# Patient Record
Sex: Female | Born: 1985
Health system: Southern US, Community
[De-identification: ages and names within clinical notes are randomized; demographics above are authoritative.]

## PROBLEM LIST (undated history)

## (undated) DIAGNOSIS — Z789 Other specified health status: Secondary | ICD-10-CM

## (undated) HISTORY — PX: INDUCED ABORTION: SHX677

---
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
## 2004-03-04 ENCOUNTER — Ambulatory Visit (HOSPITAL_COMMUNITY): Admission: RE | Admit: 2004-03-04 | Discharge: 2004-03-04 | Payer: Self-pay | Admitting: Obstetrics

## 2004-03-11 ENCOUNTER — Ambulatory Visit (HOSPITAL_COMMUNITY): Admission: RE | Admit: 2004-03-11 | Discharge: 2004-03-11 | Payer: Self-pay | Admitting: Obstetrics

## 2004-03-18 ENCOUNTER — Ambulatory Visit (HOSPITAL_COMMUNITY): Admission: RE | Admit: 2004-03-18 | Discharge: 2004-03-18 | Payer: Self-pay | Admitting: Obstetrics

## 2004-03-21 ENCOUNTER — Inpatient Hospital Stay (HOSPITAL_COMMUNITY): Admission: AD | Admit: 2004-03-21 | Discharge: 2004-03-24 | Payer: Self-pay | Admitting: Obstetrics

## 2004-03-21 ENCOUNTER — Inpatient Hospital Stay (HOSPITAL_COMMUNITY): Admission: RE | Admit: 2004-03-21 | Discharge: 2004-03-21 | Payer: Self-pay | Admitting: Obstetrics

## 2004-12-16 IMAGING — US US OB LIMITED
1 series · 14 of 28 positions shown · non-contrast
Comparison: none

CLINICAL DATA: 17-year-old.  G2 P0 with LMP of 06/16/03.  Polyhydramnios.  37 weeks.

[Series 1: unknown · 0.32mm/px · 14 of 30 slices shown]
[im 2/30]
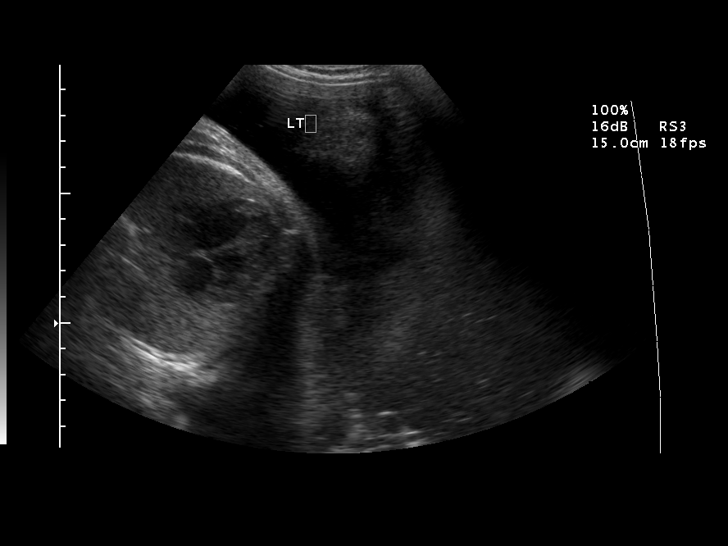
[im 4/30]
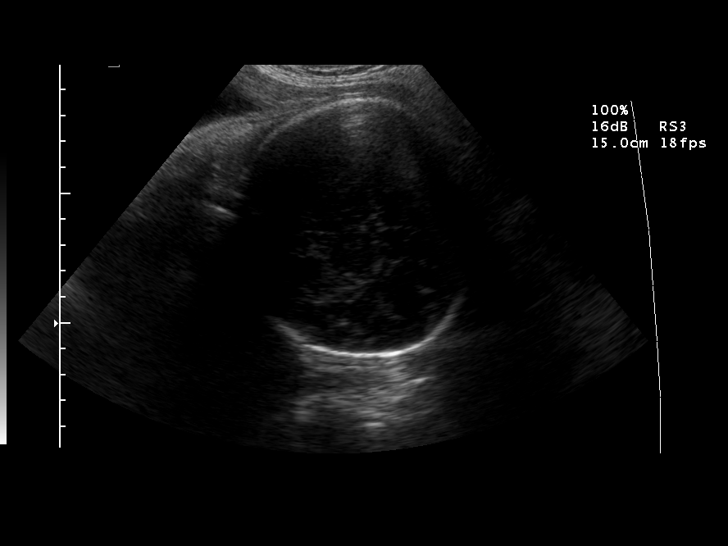
[im 6/30]
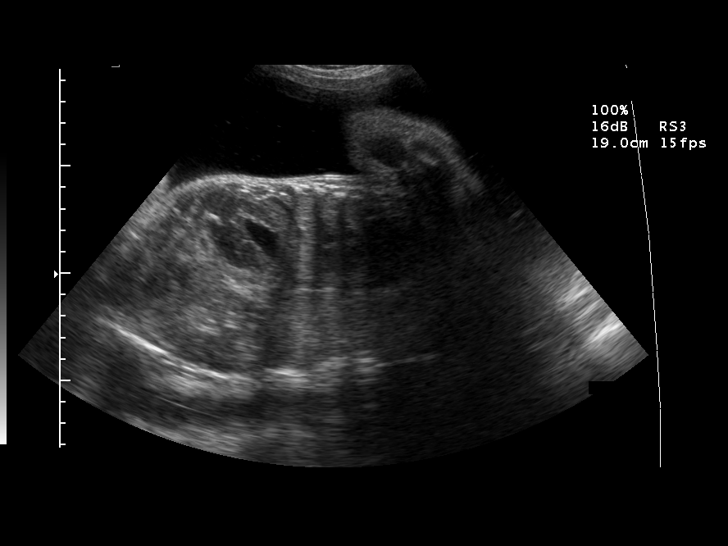
[im 8/30]
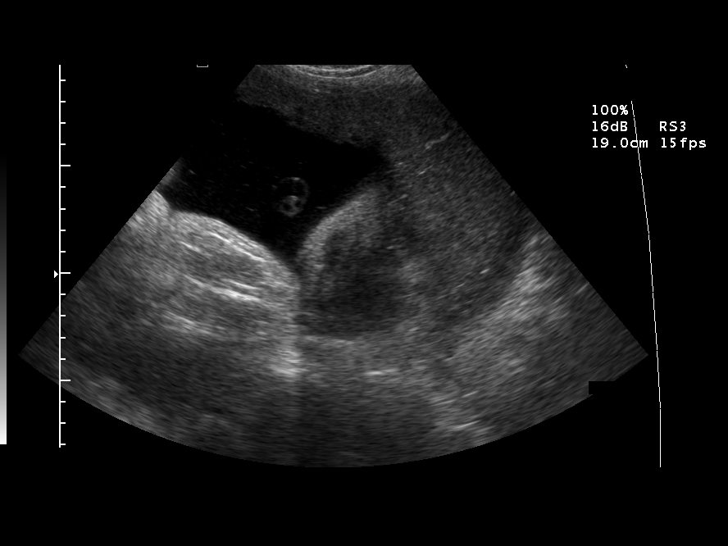
[im 10/30]
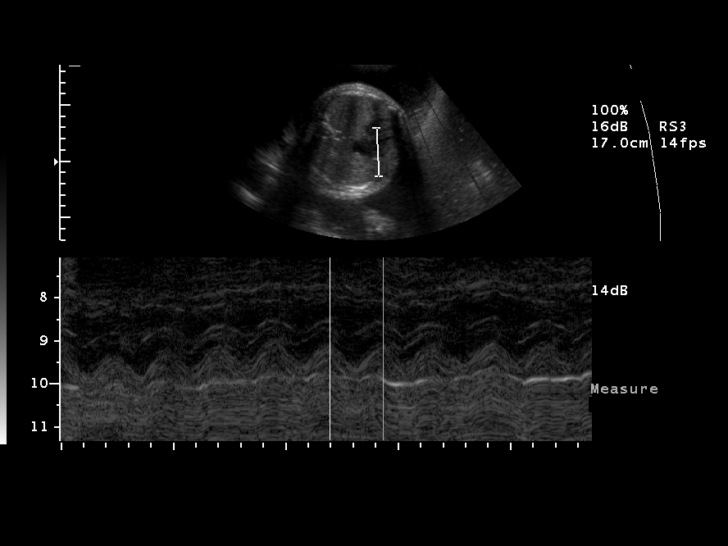
[im 12/30]
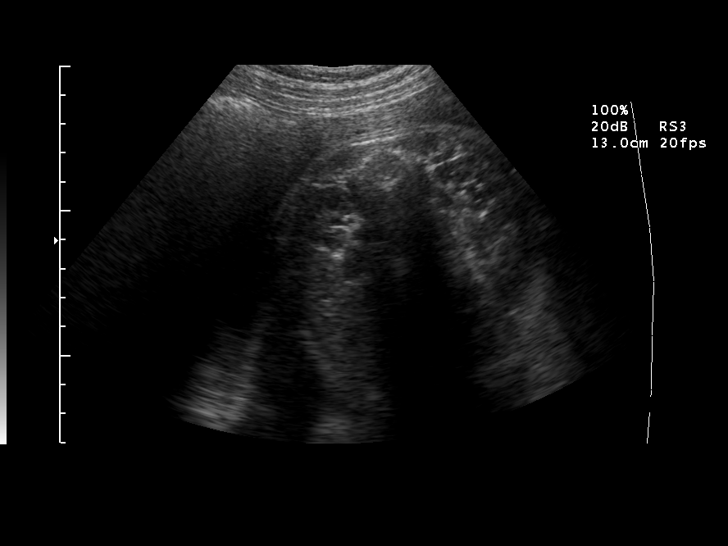
[im 14/30]
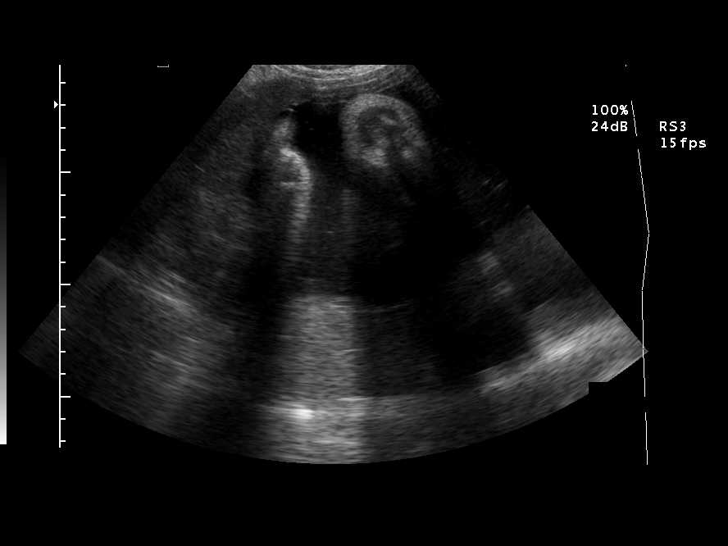
[im 17/30]
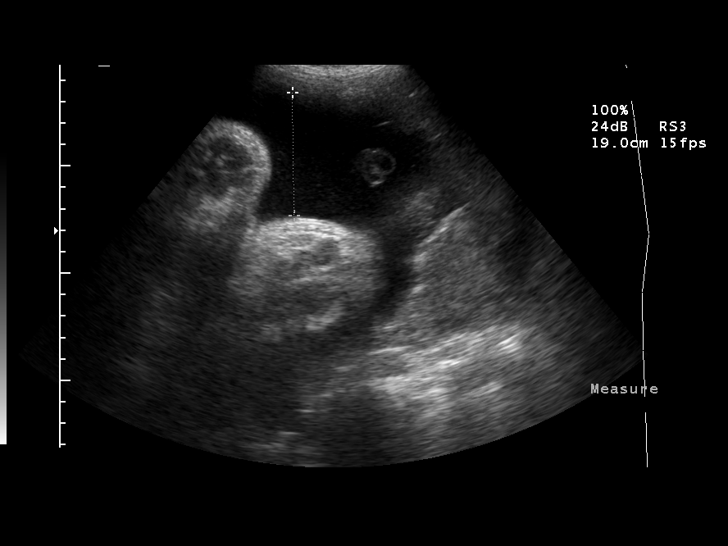
[im 19/30]
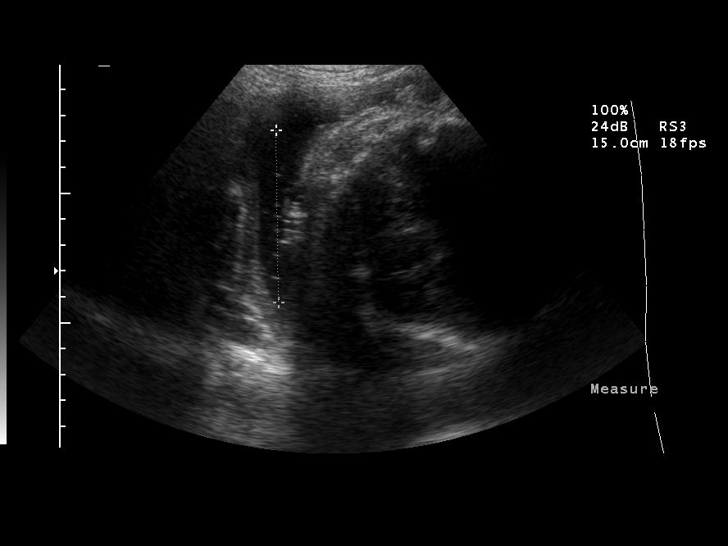
[im 21/30]
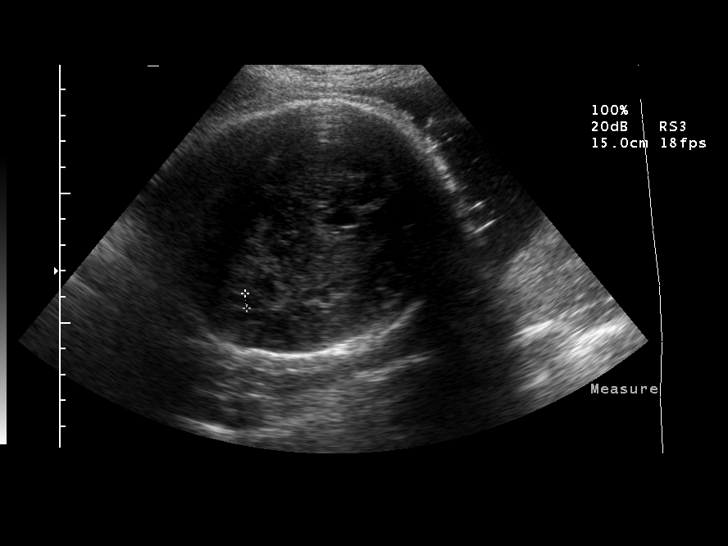
[im 23/30]
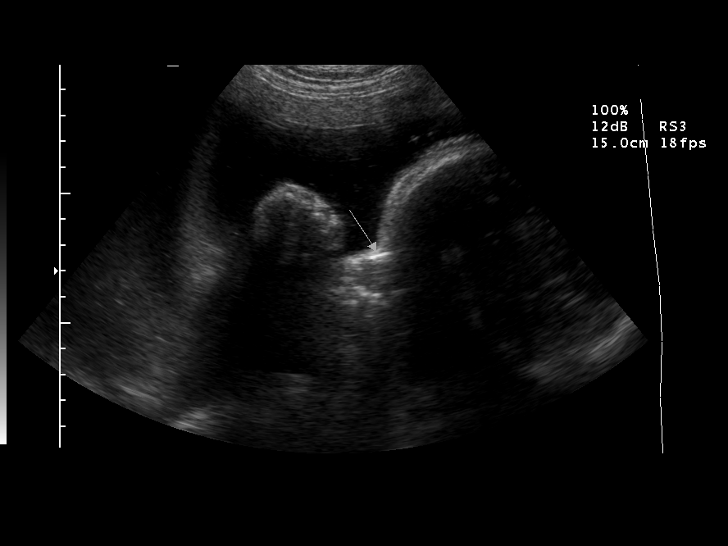
[im 25/30]
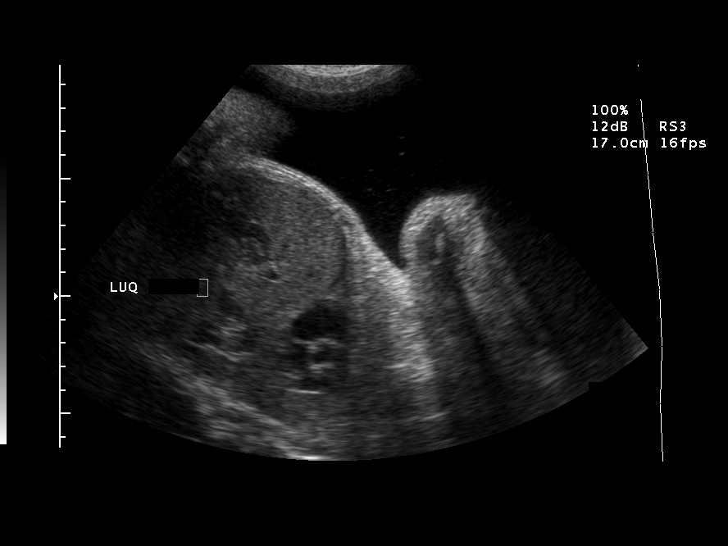
[im 27/30]
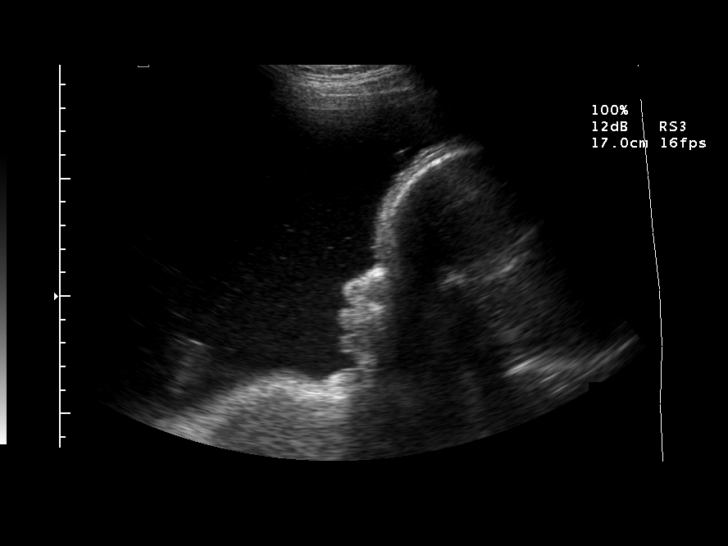
[im 30/30]
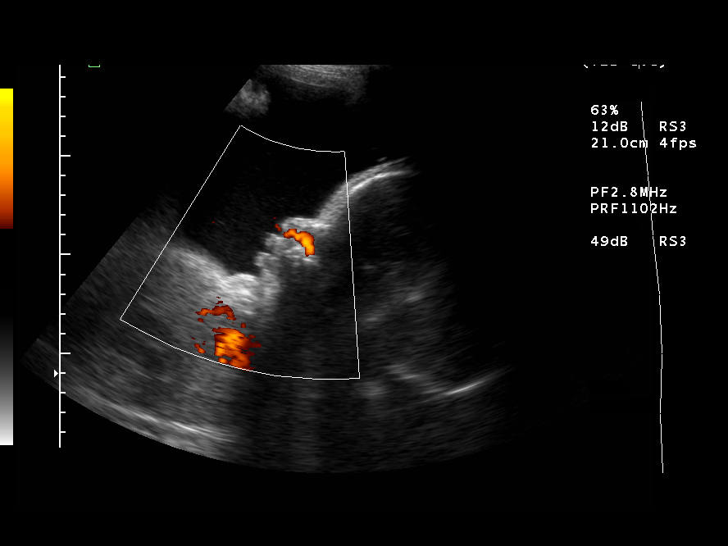

[14 of 28 positions shown; findings below may reference images not displayed]

LIMITED OBSTETRICAL ULTRASOUND
 Number of Fetuses:  1
 Heart Rate:  127
 Movement:  Yes
 Breathing:  Yes
 Presentation:  Cephalic 
 Placental Location:  Fundal, left lateral
 Grade:  II
 Previa:  No
 Amniotic Fluid (Subjective):  Increased
 Amniotic Fluid (Objective):  31.5 cm AFI (5th -95th%ile =   7.3 ? 23.9 cm for 38 wks)
 Assigned GA:  37 w 4 d

 Fetal measurements and complete anatomic evaluation were not requested.  The following fetal anatomy was visualized during this exam:  Lateral ventricles, four chamber heart, 3-vessel cord, kidneys, bladder and diaphragm
 Comment:  No fluid filled stomach observed.  Meconium filled bowel loops are seen in both the left upper quadrant and right upper quadrant.  

 MATERNAL FINDINGS
 Cervix:  Not evaluated
IMPRESSION: Single living intrauterine fetus in cephalic presentation. There continues to be polyhydramnios though AFI is less than on the prior exam.  No fluid-filled stomach is identified on today?s exam.  Question is raised of esophageal atresia.

## 2004-12-19 IMAGING — US US MFM AMNIOCENTESIS (MODIFY)
1 series · 9 of 9 positions shown · non-contrast
Comparison: none

[Series 1: unknown · 0.33mm/px · 9 of 9 slices shown]
[im 1/9]
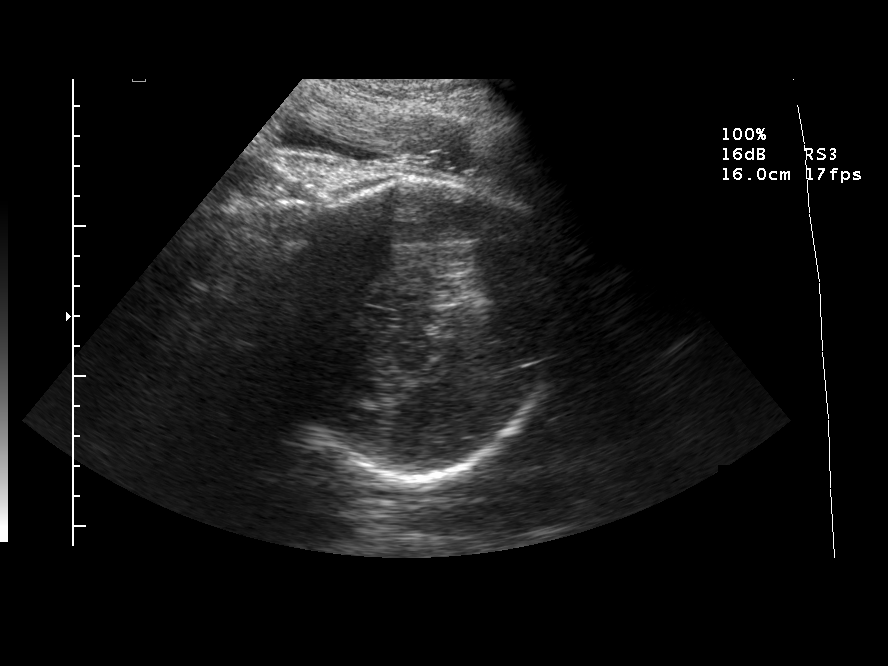
[im 2/9]
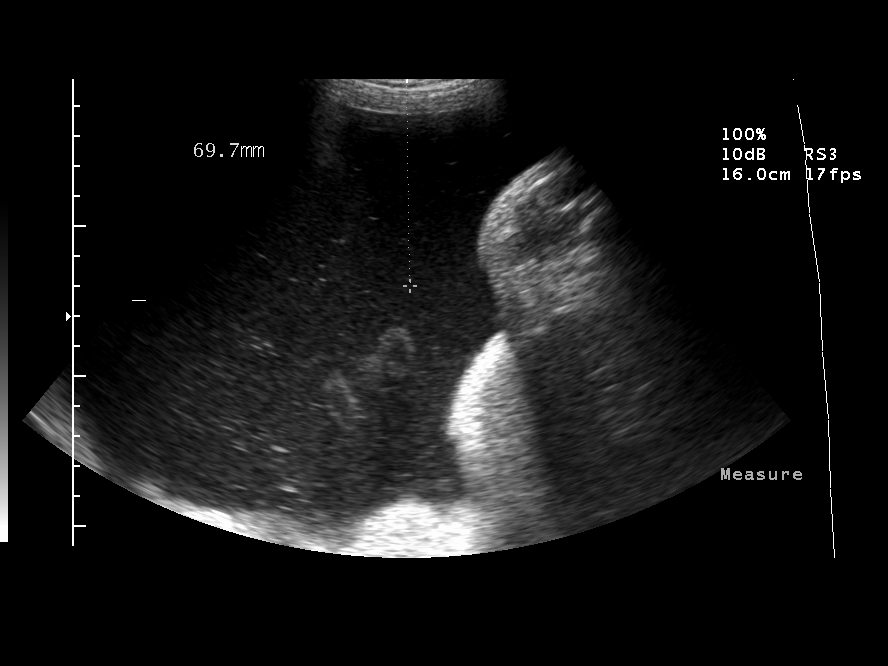
[im 3/9]
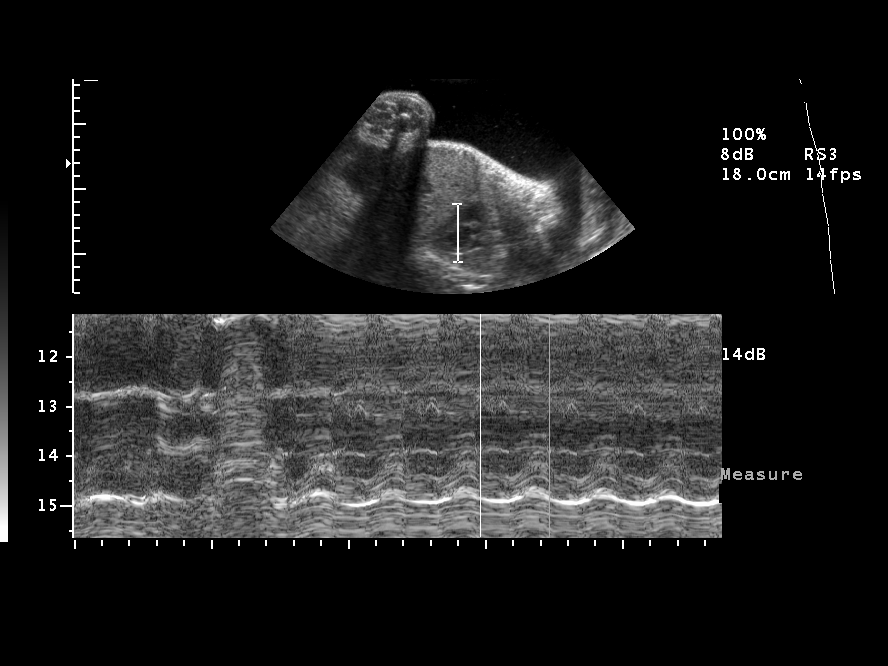
[im 4/9]
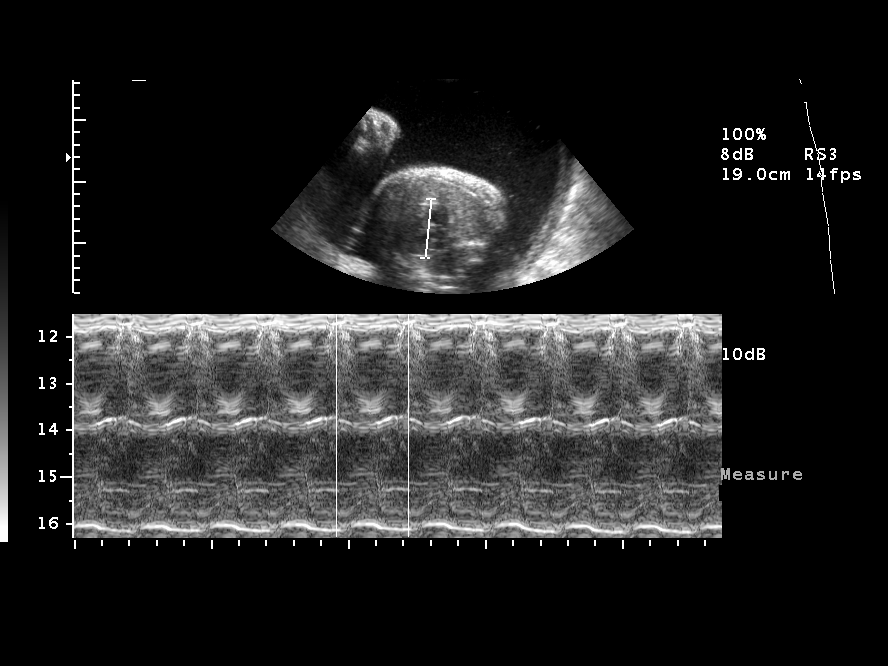
[im 5/9]
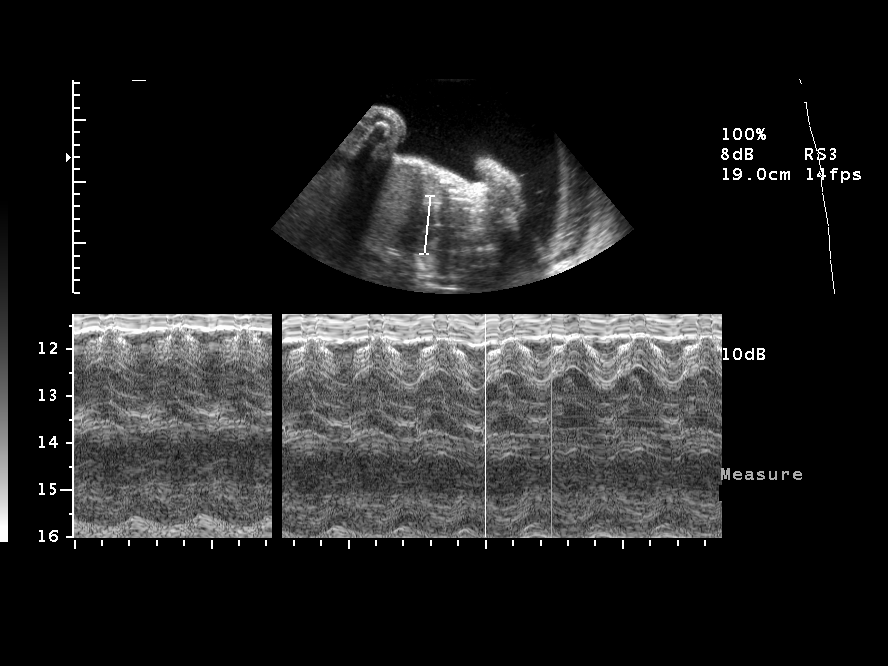
[im 6/9]
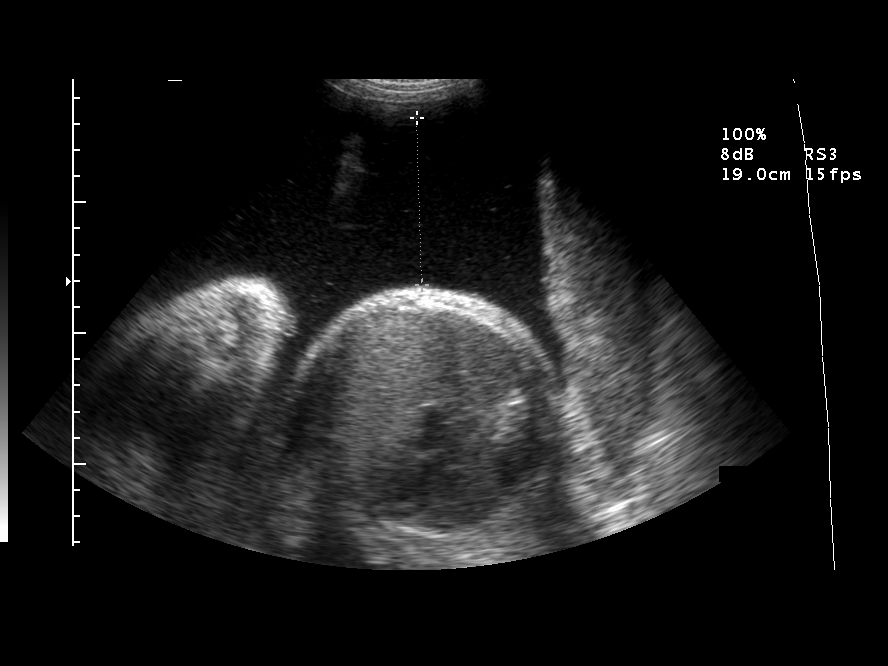
[im 7/9]
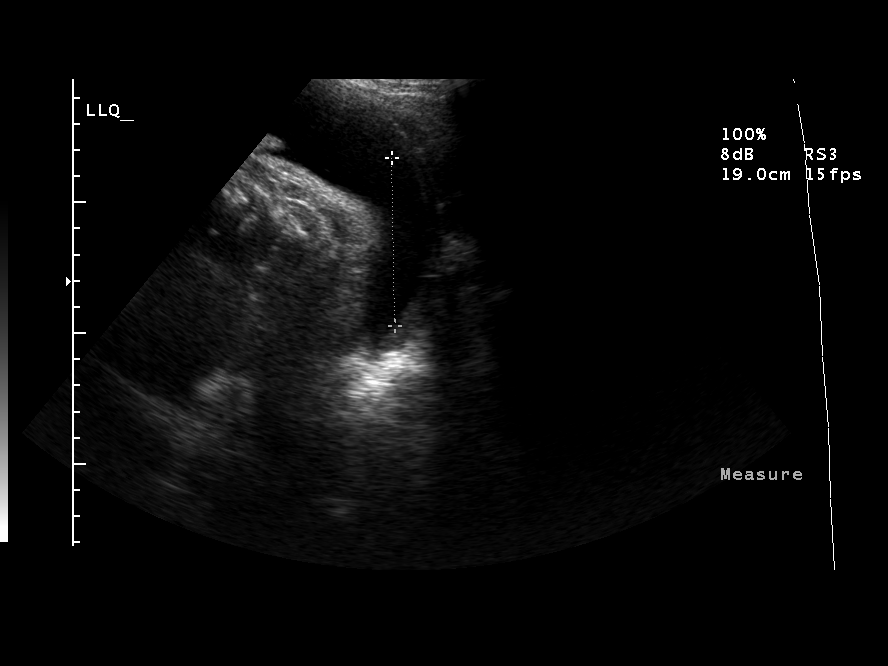
[im 8/9]
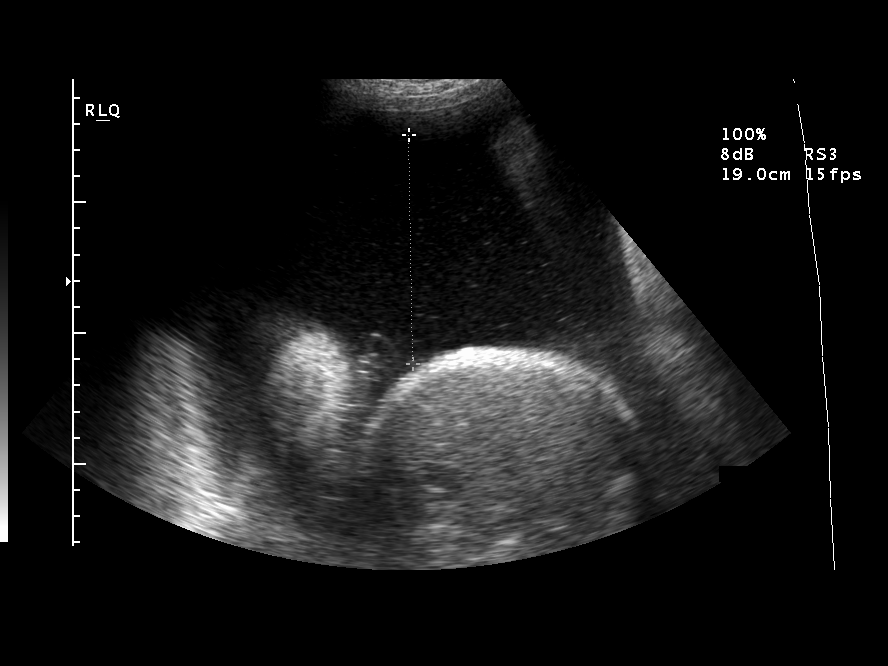
[im 9/9]
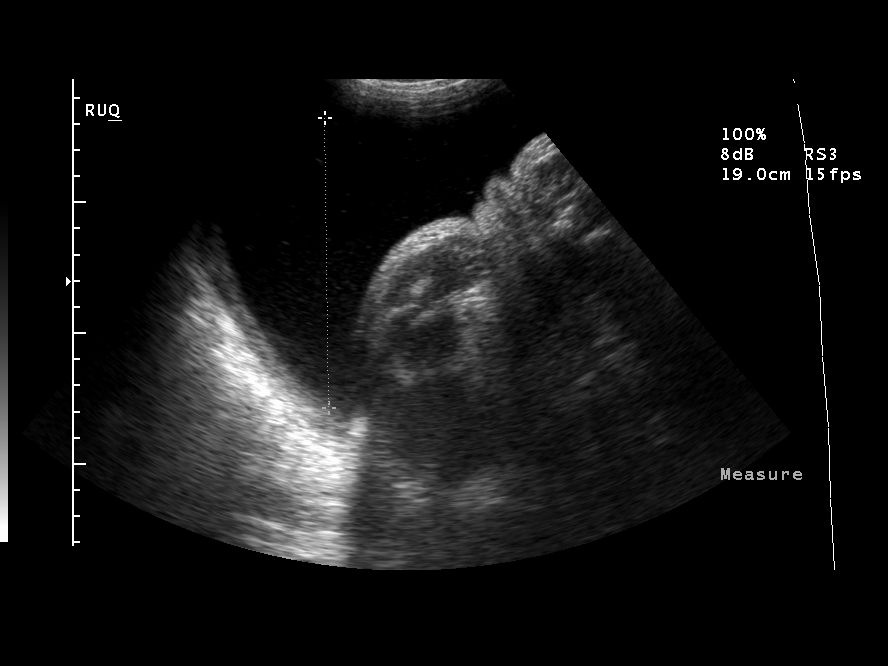

[9 of 9 positions shown; findings below may reference images not displayed]

ULTRASOUND AMNIOCENTESIS: 
 Ultrasound was utilized to perform amniocentesis by the requesting physician.  
 IMPRESSION
 No radiographic interpretation.

## 2005-01-03 ENCOUNTER — Inpatient Hospital Stay (HOSPITAL_COMMUNITY): Admission: AD | Admit: 2005-01-03 | Discharge: 2005-01-03 | Payer: Self-pay | Admitting: *Deleted

## 2005-01-25 ENCOUNTER — Inpatient Hospital Stay (HOSPITAL_COMMUNITY): Admission: AD | Admit: 2005-01-25 | Discharge: 2005-01-25 | Payer: Self-pay | Admitting: Obstetrics

## 2005-02-01 ENCOUNTER — Inpatient Hospital Stay (HOSPITAL_COMMUNITY): Admission: AD | Admit: 2005-02-01 | Discharge: 2005-02-01 | Payer: Self-pay | Admitting: Obstetrics

## 2005-07-20 ENCOUNTER — Emergency Department (HOSPITAL_COMMUNITY): Admission: EM | Admit: 2005-07-20 | Discharge: 2005-07-20 | Payer: Self-pay | Admitting: Emergency Medicine

## 2005-07-22 ENCOUNTER — Emergency Department (HOSPITAL_COMMUNITY): Admission: EM | Admit: 2005-07-22 | Discharge: 2005-07-22 | Payer: Self-pay | Admitting: Emergency Medicine

## 2011-05-01 ENCOUNTER — Other Ambulatory Visit: Payer: Self-pay | Admitting: Obstetrics and Gynecology

## 2014-11-29 ENCOUNTER — Other Ambulatory Visit: Payer: Self-pay | Admitting: Obstetrics & Gynecology

## 2014-12-04 LAB — CYTOLOGY - PAP

## 2015-05-22 ENCOUNTER — Encounter (HOSPITAL_COMMUNITY): Payer: Self-pay | Admitting: Emergency Medicine

## 2015-05-22 ENCOUNTER — Emergency Department (HOSPITAL_COMMUNITY)
Admission: EM | Admit: 2015-05-22 | Discharge: 2015-05-22 | Disposition: A | Discharge status: Home / Self Care | Payer: 59 | Attending: Emergency Medicine | Admitting: Emergency Medicine

## 2015-05-22 ENCOUNTER — Emergency Department (HOSPITAL_COMMUNITY): Payer: 59

## 2015-05-22 DIAGNOSIS — M7712 Lateral epicondylitis, left elbow: Secondary | ICD-10-CM

## 2015-05-22 DIAGNOSIS — M25522 Pain in left elbow: Secondary | ICD-10-CM | POA: Diagnosis present

## 2015-05-22 NOTE — ED Notes (Signed)
Patient transported to X-ray 

## 2015-05-22 NOTE — ED Notes (Signed)
Pt. reports left elbow pain with swelling/stiffness onset this afternoon , denies injury or fall .

## 2015-05-22 NOTE — ED Provider Notes (Signed)
CSN: [id number]     Arrival date & time 05/22/15  2136 History  This chart was scribed for non-physician practitioner working with Mancel BaleElliott Wentz, MD by Richarda Overlieichard Holland, ED Scribe. This patient was seen in room TR04C/TR04C and the patient's care was started at 9:47 PM.   Chief Complaint  Patient presents with  . Elbow Pain   The history is provided by the patient.   HPI Comments: Danielle Ochoa is a 29 y.o. female who presents to the Emergency Department complaining of sharp throbbing left elbow pain that started earlier this afternoon. Pt denies any injuries or falls. She states that bending her elbow drastically aggravates her pain, radiating to her pinky finger. She says that it feels like her elbow is slightly swollen as well. Pt states that she has taken no medications for her symptoms. She reports that she has not been on any extended travel recently. She denies fever, numbness or tingling.  History reviewed. No pertinent past medical history. History reviewed. No pertinent past surgical history. No family history on file. History  Substance Use Topics  . Smoking status: Never Smoker   . Smokeless tobacco: Not on file  . Alcohol Use: Yes   OB History    No data available     Review of Systems  Constitutional: Negative for fever.  Musculoskeletal: Positive for joint swelling and arthralgias.  Neurological: Negative for numbness.  All other systems reviewed and are negative.     Allergies  Review of patient's allergies indicates no known allergies.  Home Medications   Prior to Admission medications   Not on File   BP 139/89 mmHg  Pulse 75  Temp(Src) 98.3 F (36.8 C) (Oral)  Resp 16  SpO2 98%  LMP 04/29/2015 Physical Exam  Constitutional: She is oriented to person, place, and time. She appears well-developed and well-nourished. No distress.  HENT:  Head: Normocephalic and atraumatic.  Mouth/Throat: Oropharynx is clear and moist.  Eyes: Conjunctivae and EOM are  normal.  Neck: Normal range of motion. Neck supple.  Cardiovascular: Normal rate, regular rhythm and normal heart sounds.   Pulmonary/Chest: Effort normal and breath sounds normal. No respiratory distress.  Musculoskeletal:  L elbow TTP over lateral epicondyle with minimal swelling. Pain increased with elbow flexion. No erythema or warmth.  Neurological: She is alert and oriented to person, place, and time. No sensory deficit.  Skin: Skin is warm and dry.  Psychiatric: She has a normal mood and affect. Her behavior is normal.  Nursing note and vitals reviewed.   ED Course  Procedures   DIAGNOSTIC STUDIES: Oxygen Saturation is 100% on RA, normal by my interpretation.    COORDINATION OF CARE: 9:50 PM Discussed treatment plan with pt at bedside and pt agreed to plan.   Labs Review Labs Reviewed - No data to display  Imaging Review Dg Elbow Complete Left  05/22/2015   CLINICAL DATA:  Acute onset of left elbow pain and swelling for 2 days. Initial encounter.  EXAM: LEFT ELBOW - COMPLETE 3+ VIEW  COMPARISON:  None.  FINDINGS: Small calcifications at the lateral epicondyle may reflect lateral epicondylitis. Would correlate for associated symptoms.  There is no evidence of fracture or dislocation. The visualized joint spaces are preserved. No significant joint effusion is identified. The soft tissues are unremarkable in appearance.  IMPRESSION: Calcifications at the lateral epicondyle may reflect underlying lateral epicondylitis. Would correlate for associated symptoms. No evidence of fracture or dislocation.   Electronically Signed   By: Leotis ShamesJeffery  Chang M.D.   On: 05/22/2015 22:30     EKG Interpretation None      MDM   Final diagnoses:  Lateral epicondylitis, left   NAD. Neurovascularly intact. Tenderness over lateral epicondyle with minimal swelling. No erythema or warmth concerning for infection. X-ray showing calcifications at the lateral epicondyle possibly reflecting underlying  lateral epicondylitis. This does associate with symptoms. Advised rest, ice and NSAIDs. If no improvement in one week, follow-up with orthopedics. Stable for discharge. Return precautions given. Patient states understanding of treatment care plan and is agreeable.  I personally performed the services described in this documentation, which was scribed in my presence. The recorded information has been reviewed and is accurate.  Kathrynn Speed, PA-C 05/22/15 2248  Mancel Bale, MD 05/23/15 [phone]

## 2015-05-22 NOTE — Discharge Instructions (Signed)
Rest, ice and elevate your elbow. You may take ibuprofen, 600 mg - 800 mg or naproxen twice daily.  Tennis Elbow Your caregiver has diagnosed you with a condition often referred to as "tennis elbow." This results from small tears or soreness (inflammation) at the start (origin) of the extensor muscles of the forearm. Although the condition is often called tennis or golfer's elbow, it is caused by any repetitive action performed by your elbow. HOME CARE INSTRUCTIONS  If the condition has been short lived, rest may be the only treatment required. Using your opposite hand or arm to perform the task may help. Even changing your grip may help rest the extremity. These may even prevent the condition from recurring.  Longer standing problems, however, will often be relieved faster by:  Using anti-inflammatory agents.  Applying ice packs for 30 minutes at the end of the working day, at bed time, or when activities are finished.  Your caregiver may also have you wear a splint or sling. This will allow the inflamed tendon to heal. At times, steroid injections aided with a local anesthetic will be required along with splinting for 1 to 2 weeks. Two to three steroid injections will often solve the problem. In some long standing cases, the inflamed tendon does not respond to conservative (non-surgical) therapy. Then surgery may be required to repair it. MAKE SURE YOU:   Understand these instructions.  Will watch your condition.  Will get help right away if you are not doing well or get worse. Document Released: 12/07/2005 Document Revised: 02/29/2012 Document Reviewed: 07/25/2008 [facility] Patient Information 2015 [place], [place]. This information is not intended to replace advice given to you by your health care provider. Make sure you discuss any questions you have with your health care provider.  Lateral Epicondylitis (Tennis Elbow) with Rehab Lateral epicondylitis involves inflammation and pain  around the outer portion of the elbow. The pain is caused by inflammation of the tendons in the forearm that bring back (extend) the wrist. Lateral epicondylitis is also called tennis elbow, because it is very common in tennis players. However, it may occur in any individual who extends the wrist repetitively. If lateral epicondylitis is left untreated, it may become a chronic problem. SYMPTOMS   Pain, tenderness, and inflammation on the outer (lateral) side of the elbow.  Pain or weakness with gripping activities.  Pain that increases with wrist-twisting motions (playing tennis, using a screwdriver, opening a door or a jar).  Pain with lifting objects, including a coffee cup. CAUSES  Lateral epicondylitis is caused by inflammation of the tendons that extend the wrist. Causes of injury may include:  Repetitive stress and strain on the muscles and tendons that extend the wrist.  Sudden change in activity level or intensity.  Incorrect grip in racquet sports.  Incorrect grip size of racquet (often too large).  Incorrect hitting position or technique (usually backhand, leading with the elbow).  Using a racket that is too heavy. RISK INCREASES WITH:  Sports or occupations that require repetitive and/or strenuous forearm and wrist movements (tennis, squash, racquetball, carpentry).  Poor wrist and forearm strength and flexibility.  Failure to warm up properly before activity.  Resuming activity before healing, rehabilitation, and conditioning are complete. PREVENTION   Warm up and stretch properly before activity.  Maintain physical fitness:  Strength, flexibility, and endurance.  Cardiovascular fitness.  Wear and use properly fitted equipment.  Learn and use proper technique and have a coach correct improper technique.  Wear a  tennis elbow (counterforce) brace. PROGNOSIS  The course of this condition depends on the degree of the injury. If treated properly, acute cases  (symptoms lasting less than 4 weeks) are often resolved in 2 to 6 weeks. Chronic (longer lasting cases) often resolve in 3 to 6 months but may require physical therapy. RELATED COMPLICATIONS   Frequently recurring symptoms, resulting in a chronic problem. Properly treating the problem the first time decreases frequency of recurrence.  Chronic inflammation, scarring tendon degeneration, and partial tendon tear, requiring surgery.  Delayed healing or resolution of symptoms. TREATMENT  Treatment first involves the use of ice and medicine to reduce pain and inflammation. Strengthening and stretching exercises may help reduce discomfort if performed regularly. These exercises may be performed at home if the condition is an acute injury. Chronic cases may require a referral to a physical therapist for evaluation and treatment. Your caregiver may advise a corticosteroid injection to help reduce inflammation. Rarely, surgery is needed. MEDICATION  If pain medicine is needed, nonsteroidal anti-inflammatory medicines (aspirin and ibuprofen), or other minor pain relievers (acetaminophen), are often advised.  Do not take pain medicine for 7 days before surgery.  Prescription pain relievers may be given, if your caregiver thinks they are needed. Use only as directed and only as much as you need.  Corticosteroid injections may be recommended. These injections should be reserved only for the most severe cases, because they can only be given a certain number of times. HEAT AND COLD  Cold treatment (icing) should be applied for 10 to 15 minutes every 2 to 3 hours for inflammation and pain, and immediately after activity that aggravates your symptoms. Use ice packs or an ice massage.  Heat treatment may be used before performing stretching and strengthening activities prescribed by your caregiver, physical therapist, or athletic trainer. Use a heat pack or a warm water soak. SEEK MEDICAL CARE IF: Symptoms get  worse or do not improve in 2 weeks, despite treatment. EXERCISES  RANGE OF MOTION (ROM) AND STRETCHING EXERCISES - Epicondylitis, Lateral (Tennis Elbow) These exercises may help you when beginning to rehabilitate your injury. Your symptoms may go away with or without further involvement from your physician, physical therapist, or athletic trainer. While completing these exercises, remember:   Restoring tissue flexibility helps normal motion to return to the joints. This allows healthier, less painful movement and activity.  An effective stretch should be held for at least 30 seconds.  A stretch should never be painful. You should only feel a gentle lengthening or release in the stretched tissue. RANGE OF MOTION - Wrist Flexion, Active-Assisted  Extend your right / left elbow with your fingers pointing down.*  Gently pull the back of your hand towards you, until you feel a gentle stretch on the top of your forearm.  Hold this position for __________ seconds. Repeat __________ times. Complete this exercise __________ times per day.  *If directed by your physician, physical therapist or athletic trainer, complete this stretch with your elbow bent, rather than extended. RANGE OF MOTION - Wrist Extension, Active-Assisted  Extend your right / left elbow and turn your palm upwards.*  Gently pull your palm and fingertips back, so your wrist extends and your fingers point more toward the ground.  You should feel a gentle stretch on the inside of your forearm.  Hold this position for __________ seconds. Repeat __________ times. Complete this exercise __________ times per day. *If directed by your physician, physical therapist or athletic trainer, complete this stretch  with your elbow bent, rather than extended. STRETCH - Wrist Flexion  Place the back of your right / left hand on a tabletop, leaving your elbow slightly bent. Your fingers should point away from your body.  Gently press the back  of your hand down onto the table by straightening your elbow. You should feel a stretch on the top of your forearm.  Hold this position for __________ seconds. Repeat __________ times. Complete this stretch __________ times per day.  STRETCH - Wrist Extension   Place your right / left fingertips on a tabletop, leaving your elbow slightly bent. Your fingers should point backwards.  Gently press your fingers and palm down onto the table by straightening your elbow. You should feel a stretch on the inside of your forearm.  Hold this position for __________ seconds. Repeat __________ times. Complete this stretch __________ times per day.  STRENGTHENING EXERCISES - Epicondylitis, Lateral (Tennis Elbow) These exercises may help you when beginning to rehabilitate your injury. They may resolve your symptoms with or without further involvement from your physician, physical therapist, or athletic trainer. While completing these exercises, remember:   Muscles can gain both the endurance and the strength needed for everyday activities through controlled exercises.  Complete these exercises as instructed by your physician, physical therapist or athletic trainer. Increase the resistance and repetitions only as guided.  You may experience muscle soreness or fatigue, but the pain or discomfort you are trying to eliminate should never worsen during these exercises. If this pain does get worse, stop and make sure you are following the directions exactly. If the pain is still present after adjustments, discontinue the exercise until you can discuss the trouble with your caregiver. STRENGTH - Wrist Flexors  Sit with your right / left forearm palm-up and fully supported on a table or countertop. Your elbow should be resting below the height of your shoulder. Allow your wrist to extend over the edge of the surface.  Loosely holding a __________ weight, or a piece of rubber exercise band or tubing, slowly curl your  hand up toward your forearm.  Hold this position for __________ seconds. Slowly lower the wrist back to the starting position in a controlled manner. Repeat __________ times. Complete this exercise __________ times per day.  STRENGTH - Wrist Extensors  Sit with your right / left forearm palm-down and fully supported on a table or countertop. Your elbow should be resting below the height of your shoulder. Allow your wrist to extend over the edge of the surface.  Loosely holding a __________ weight, or a piece of rubber exercise band or tubing, slowly curl your hand up toward your forearm.  Hold this position for __________ seconds. Slowly lower the wrist back to the starting position in a controlled manner. Repeat __________ times. Complete this exercise __________ times per day.  STRENGTH - Ulnar Deviators  Stand with a ____________________ weight in your right / left hand, or sit while holding a rubber exercise band or tubing, with your healthy arm supported on a table or countertop.  Move your wrist, so that your pinkie travels toward your forearm and your thumb moves away from your forearm.  Hold this position for __________ seconds and then slowly lower the wrist back to the starting position. Repeat __________ times. Complete this exercise __________ times per day STRENGTH - Radial Deviators  Stand with a ____________________ weight in your right / left hand, or sit while holding a rubber exercise band or tubing, with your  injured arm supported on a table or countertop.  Raise your hand upward in front of you or pull up on the rubber tubing.  Hold this position for __________ seconds and then slowly lower the wrist back to the starting position. Repeat __________ times. Complete this exercise __________ times per day. STRENGTH - Forearm Supinators   Sit with your right / left forearm supported on a table, keeping your elbow below shoulder height. Rest your hand over the edge, palm  down.  Gently grip a hammer or a soup ladle.  Without moving your elbow, slowly turn your palm and hand upward to a "thumbs-up" position.  Hold this position for __________ seconds. Slowly return to the starting position. Repeat __________ times. Complete this exercise __________ times per day.  STRENGTH - Forearm Pronators   Sit with your right / left forearm supported on a table, keeping your elbow below shoulder height. Rest your hand over the edge, palm up.  Gently grip a hammer or a soup ladle.  Without moving your elbow, slowly turn your palm and hand upward to a "thumbs-up" position.  Hold this position for __________ seconds. Slowly return to the starting position. Repeat __________ times. Complete this exercise __________ times per day.  STRENGTH - Grip  Grasp a tennis ball, a dense sponge, or a large, rolled sock in your hand.  Squeeze as hard as you can, without increasing any pain.  Hold this position for __________ seconds. Release your grip slowly. Repeat __________ times. Complete this exercise __________ times per day.  STRENGTH - Elbow Extensors, Isometric  Stand or sit upright, on a firm surface. Place your right / left arm so that your palm faces your stomach, and it is at the height of your waist.  Place your opposite hand on the underside of your forearm. Gently push up as your right / left arm resists. Push as hard as you can with both arms, without causing any pain or movement at your right / left elbow. Hold this stationary position for __________ seconds. Gradually release the tension in both arms. Allow your muscles to relax completely before repeating. Document Released: 12/07/2005 Document Revised: 04/23/2014 Document Reviewed: 03/21/2009 [facility] Patient Information 2015 [place], [place]. This information is not intended to replace advice given to you by your health care provider. Make sure you discuss any questions you have with your health care  provider.

## 2015-11-28 ENCOUNTER — Inpatient Hospital Stay (HOSPITAL_COMMUNITY)
Admission: AD | Admit: 2015-11-28 | Discharge: 2015-11-28 | Disposition: A | Discharge status: Home / Self Care | Payer: 59 | Source: Ambulatory Visit | Attending: Obstetrics and Gynecology | Admitting: Obstetrics and Gynecology

## 2015-11-28 ENCOUNTER — Inpatient Hospital Stay (HOSPITAL_COMMUNITY): Payer: 59

## 2015-11-28 ENCOUNTER — Encounter (HOSPITAL_COMMUNITY): Payer: Self-pay | Admitting: *Deleted

## 2015-11-28 DIAGNOSIS — O209 Hemorrhage in early pregnancy, unspecified: Secondary | ICD-10-CM

## 2015-11-28 DIAGNOSIS — Z3A01 Less than 8 weeks gestation of pregnancy: Secondary | ICD-10-CM | POA: Diagnosis not present

## 2015-11-28 DIAGNOSIS — O00109 Unspecified tubal pregnancy without intrauterine pregnancy: Secondary | ICD-10-CM

## 2015-11-28 DIAGNOSIS — O001 Tubal pregnancy without intrauterine pregnancy: Secondary | ICD-10-CM | POA: Insufficient documentation

## 2015-11-28 HISTORY — DX: Other specified health status: Z78.9

## 2015-11-28 LAB — URINALYSIS, ROUTINE W REFLEX MICROSCOPIC
BILIRUBIN URINE: NEGATIVE
GLUCOSE, UA: NEGATIVE mg/dL
Ketones, ur: NEGATIVE mg/dL
Leukocytes, UA: NEGATIVE
Nitrite: NEGATIVE
PROTEIN: NEGATIVE mg/dL
Specific Gravity, Urine: 1.015 (ref 1.005–1.030)
pH: 6.5 (ref 5.0–8.0)

## 2015-11-28 LAB — CBC
HEMATOCRIT: 37.2 % (ref 36.0–46.0)
HEMOGLOBIN: 12.6 g/dL (ref 12.0–15.0)
MCH: 30.9 pg (ref 26.0–34.0)
MCHC: 33.9 g/dL (ref 30.0–36.0)
MCV: 91.2 fL (ref 78.0–100.0)
Platelets: 275 10*3/uL (ref 150–400)
RBC: 4.08 MIL/uL (ref 3.87–5.11)
RDW: 12.6 % (ref 11.5–15.5)
WBC: 6 10*3/uL (ref 4.0–10.5)

## 2015-11-28 LAB — CREATININE, SERUM
CREATININE: 0.65 mg/dL (ref 0.44–1.00)
GFR calc Af Amer: >60 mL/min (ref >60)

## 2015-11-28 LAB — ABO/RH: ABO/RH(D): O POS

## 2015-11-28 LAB — BUN: BUN: 12 mg/dL (ref 6–20)

## 2015-11-28 LAB — URINE MICROSCOPIC-ADD ON: WBC, UA: NONE SEEN WBC/hpf (ref 0–5)

## 2015-11-28 LAB — AST: AST: 16 U/L (ref 15–41)

## 2015-11-28 LAB — POCT PREGNANCY, URINE: PREG TEST UR: POSITIVE — AB

## 2015-11-28 LAB — HCG, QUANTITATIVE, PREGNANCY: HCG, BETA CHAIN, QUANT, S: 1967 m[IU]/mL — AB (ref <5)

## 2015-11-28 MED ORDER — KETOROLAC TROMETHAMINE 60 MG/2ML IM SOLN
60.0000 mg | Freq: Once | INTRAMUSCULAR | Status: AC
  Administered 2015-11-28: 60 mg via INTRAMUSCULAR
  Filled 2015-11-28: qty 2

## 2015-11-28 MED ORDER — HYDROCODONE-ACETAMINOPHEN 5-325 MG PO TABS
1.0000 | ORAL_TABLET | Freq: Once | ORAL | Status: AC
  Administered 2015-11-28: 1 via ORAL
  Filled 2015-11-28: qty 1

## 2015-11-28 MED ORDER — METHOTREXATE INJECTION FOR WOMEN'S HOSPITAL
50.0000 mg/m2 | Freq: Once | INTRAMUSCULAR | Status: AC
  Administered 2015-11-28: 85 mg via INTRAMUSCULAR
  Filled 2015-11-28: qty 1.7

## 2015-11-28 NOTE — Discharge Instructions (Signed)
Methotrexate Treatment for an Ectopic Pregnancy °Methotrexate is a medicine that treats ectopic pregnancy by stopping the growth of the fertilized egg. It also helps your body absorb tissue from the egg. This takes between 2 weeks and 6 weeks. Most ectopic pregnancies can be successfully treated with methotrexate if they are detected early enough. °LET YOUR HEALTH CARE PROVIDER KNOW ABOUT: °· Any allergies you have. °· All medicines you are taking, including vitamins, herbs, eye drops, creams, and over-the-counter medicines. °· Medical conditions you have. °RISKS AND COMPLICATIONS °Generally, this is a safe treatment. However, as with any treatment, problems can occur. Possible problems or side effects include: °· Nausea. °· Vomiting. °· Diarrhea. °· Abdominal cramping. °· Mouth sores. °· Increased vaginal bleeding or spotting.   °· Swelling or irritation of the lining of your lungs (pneumonitis).  °· Failed treatment and continuation of the pregnancy.   °· Liver damage. °· Hair loss. °There is still a risk of the ectopic pregnancy rupturing while using the methotrexate. °BEFORE THE PROCEDURE °Before you take the medicine:  °· Liver tests, kidney tests, and a complete blood test are performed. °· Blood tests are performed to measure the pregnancy hormone levels and to determine your blood type. °· If you are Rh-negative and the father is Rh-positive or his Rh type is not known, you will be given a Rho (D) immune globulin shot. °PROCEDURE  °There are two methods that your health care provider may use to prescribe methotrexate. One method involves a single dose or injection of the medicine. Another method involves a series of doses given through several injections.  °AFTER THE PROCEDURE °· You may have some abdominal cramping, vaginal bleeding, and fatigue in the first few days after taking methotrexate. °· Blood tests will be taken for several weeks to check the pregnancy hormone levels. The blood tests are performed  until there is no more pregnancy hormone detected in the blood. °  °This information is not intended to replace advice given to you by your health care provider. Make sure you discuss any questions you have with your health care provider. °  °Document Released: 12/01/2001 Document Revised: 12/28/2014 Document Reviewed: 09/25/2013 °Elsevier Interactive Patient Education ©2016 [employer] ° °Ectopic Pregnancy °An ectopic pregnancy is when the fertilized egg attaches (implants) outside the uterus. Most ectopic pregnancies occur in the fallopian tube. Rarely do ectopic pregnancies occur on the ovary, intestine, pelvis, or cervix. In an ectopic pregnancy, the fertilized egg does not have the ability to develop into a normal, healthy baby.  °A ruptured ectopic pregnancy is one in which the fallopian tube gets torn or bursts and results in internal bleeding. Often there is intense abdominal pain, and sometimes, vaginal bleeding. Having an ectopic pregnancy can be life threatening. If left untreated, this dangerous condition can lead to a blood transfusion, abdominal surgery, or even death. °CAUSES  °Damage to the fallopian tubes is the suspected cause in most ectopic pregnancies.  °RISK FACTORS °Depending on your circumstances, the risk of having an ectopic pregnancy will vary. The level of risk can be divided into three categories. °High Risk °· You have gone through infertility treatment. °· You have had a previous ectopic pregnancy. °· You have had previous tubal surgery. °· You have had previous surgery to have the fallopian tubes tied (tubal ligation). °· You have tubal problems or diseases. °· You have been exposed to DES. DES is a medicine that was used until 1971 and had effects on babies whose mothers took the medicine. °·   You become pregnant while using an intrauterine device (IUD) for birth control.  °Moderate Risk °· You have a history of infertility. °· You have a history of a sexually transmitted infection  (STI). °· You have a history of pelvic inflammatory disease (PID). °· You have scarring from endometriosis. °· You have multiple sexual partners. °· You smoke.  °Low Risk °· You have had previous pelvic surgery. °· You use vaginal douching. °· You became sexually active before 31 years of age. °SIGNS AND SYMPTOMS  °An ectopic pregnancy should be suspected in anyone who has missed a period and has abdominal pain or bleeding. °· You may experience normal pregnancy symptoms, such as: °¨ Nausea. °¨ Tiredness. °¨ Breast tenderness. °· Other symptoms may include: °¨ Pain with intercourse. °¨ Irregular vaginal bleeding or spotting. °¨ Cramping or pain on one side or in the lower abdomen. °¨ Fast heartbeat. °¨ Passing out while having a bowel movement. °· Symptoms of a ruptured ectopic pregnancy and internal bleeding may include: °¨ Sudden, severe pain in the abdomen and pelvis. °¨ Dizziness or fainting. °¨ Pain in the shoulder area. °DIAGNOSIS  °Tests that may be performed include: °· A pregnancy test. °· An ultrasound test. °· Testing the specific level of pregnancy hormone in the bloodstream. °· Taking a sample of uterus tissue (dilation and curettage, D&C). °· Surgery to perform a visual exam of the inside of the abdomen using a thin, lighted tube with a tiny camera on the end (laparoscope). °TREATMENT  °An injection of a medicine called methotrexate may be given. This medicine causes the pregnancy tissue to be absorbed. It is given if: °· The diagnosis is made early. °· The fallopian tube has not ruptured. °· You are considered to be a good candidate for the medicine. °Usually, pregnancy hormone blood levels are checked after methotrexate treatment. This is to be sure the medicine is effective. It may take 4-6 weeks for the pregnancy to be absorbed (though most pregnancies will be absorbed by 3 weeks). °Surgical treatment may be needed. A laparoscope may be used to remove the pregnancy tissue. If severe internal  bleeding occurs, a cut (incision) may be made in the lower abdomen (laparotomy), and the ectopic pregnancy is removed. This stops the bleeding. Part of the fallopian tube, or the whole tube, may be removed as well (salpingectomy). After surgery, pregnancy hormone tests may be done to be sure there is no pregnancy tissue left. You may receive a Rho (D) immune globulin shot if you are Rh negative and the father is Rh positive, or if you do not know the Rh type of the father. This is to prevent problems with any future pregnancy. °SEEK IMMEDIATE MEDICAL CARE IF:  °You have any symptoms of an ectopic pregnancy. This is a medical emergency. °MAKE SURE YOU: °· Understand these instructions. °· Will watch your condition. °· Will get help right away if you are not doing well or get worse. °  °This information is not intended to replace advice given to you by your health care provider. Make sure you discuss any questions you have with your health care provider. °  °Document Released: 01/14/2005 Document Revised: 12/28/2014 Document Reviewed: 07/06/2013 °Elsevier Interactive Patient Education ©2016 Elsevier Inc. ° °

## 2015-11-28 NOTE — MAU Provider Note (Signed)
History     CSN: [id number]  Arrival date and time: 11/28/15 3382   First Provider Initiated Contact with Patient 11/28/15 0809      No chief complaint on file.  HPI   Danielle Ochoa is a 29 y.o. female G5P1030 at 28w6dpresenting to MAU with vaginal bleeding and worsening abdominal pressure/pain. She has had mild pain since Monday, however it has progressively gotten worse.   She had her levels drawn on Monday in the office and it came back at 600 and she went back 48 hours later and had another quant was done that was in the 1700's. She had an [place]ad[place] which showed nothing in her uterus.   She came to MAU today because she was having excruciating pain. She says her blood is very bright red and very heavy.  She is worried.   OB History    Gravida Para Term Preterm AB TAB SAB Ectopic Multiple Living   '5 1 1  3 2 1   ' 0      Past Medical History  Diagnosis Date  . Medical history non-contributory     Past Surgical History  Procedure Laterality Date  . Induced abortion      Family History  Problem Relation Age of Onset  . Alcohol abuse Neg Hx   . Arthritis Neg Hx   . Asthma Neg Hx   . Birth defects Neg Hx   . Cancer Neg Hx   . COPD Neg Hx   . Depression Neg Hx   . Diabetes Neg Hx   . Drug abuse Neg Hx   . Early death Neg Hx   . Hearing loss Neg Hx   . Heart disease Neg Hx   . Hyperlipidemia Neg Hx   . Hypertension Neg Hx   . Kidney disease Neg Hx   . Learning disabilities Neg Hx   . Mental illness Neg Hx   . Mental retardation Neg Hx   . Miscarriages / Stillbirths Neg Hx   . Stroke Neg Hx   . Vision loss Neg Hx   . Varicose Veins Neg Hx     Social History  Substance Use Topics  . Smoking status: Never Smoker   . Smokeless tobacco: None  . Alcohol Use: Yes    Allergies: No Known Allergies  No prescriptions prior to admission   Results for orders placed or performed during the hospital encounter of 11/28/15 (from the past 48 hour(s))   [facility]inal[facility], [facility]     Status: Abnormal   Collection Time: 11/28/15  7:05 AM  Result Value Ref Range   Color, Urine YELLOW YELLOW   APPearance HAZY (A) CLEAR   Specific Gravity, Urine 1.015 1.005 - 1.030   pH 6.5 5.0 - 8.0   Glucose, UA NEGATIVE NEGATIVE mg/dL   Hgb urine dipstick LARGE (A) NEGATIVE   Bilirubin Urine NEGATIVE NEGATIVE   Ketones, ur NEGATIVE NEGATIVE mg/dL   Protein, ur NEGATIVE NEGATIVE mg/dL   Nitrite NEGATIVE NEGATIVE   Leukocytes, UA NEGATIVE NEGATIVE  Urine microscopic-add on     Status: Abnormal   Collection Time: 11/28/15  7:05 AM  Result Value Ref Range   Squamous Epithelial / LPF 0-5 (A) NONE SEEN   WBC, UA NONE SEEN 0 - 5 WBC/hpf   RBC / HPF TOO NUMEROUS TO COUNT 0 - 5 RBC/hpf   Bacteria, UA RARE (A) NONE SEEN  Pregnancy, urine POC     Status:  Abnormal   Collection Time: 11/28/15  7:29 AM  Result Value Ref Range   Preg Test, Ur POSITIVE (A) NEGATIVE    Comment:        THE SENSITIVITY OF THIS METHODOLOGY IS >24 mIU/mL   CBC     Status: None   Collection Time: 11/28/15  8:15 AM  Result Value Ref Range   WBC 6.0 4.0 - 10.5 K/uL   RBC 4.08 3.87 - 5.11 MIL/uL   Hemoglobin 12.6 12.0 - 15.0 g/dL   HCT 37.2 36.0 - 46.0 %   MCV 91.2 78.0 - 100.0 fL   MCH 30.9 26.0 - 34.0 pg   MCHC 33.9 30.0 - 36.0 g/dL   RDW 12.6 11.5 - 15.5 %   Platelets 275 150 - 400 K/uL  ABO/Rh     Status: None   Collection Time: 11/28/15  8:15 AM  Result Value Ref Range   ABO/RH(D) O POS   hCG, quantitative, pregnancy     Status: Abnormal   Collection Time: 11/28/15  8:15 AM  Result Value Ref Range   hCG, Beta Chain, Quant, S 1967 (H) <5 mIU/mL    Comment:          GEST. AGE      CONC.  (mIU/mL)   <=1 WEEK        5 - 50     2 WEEKS       50 - 500     3 WEEKS       100 - 10,000     4 WEEKS     1,000 - 30,000     5 WEEKS     3,500 - 115,000   6-8 WEEKS     12,000 - 270,000    12 WEEKS     15,000 - 220,000        FEMALE AND NON-PREGNANT  FEMALE:     LESS THAN 5 mIU/mL   AST     Status: None   Collection Time: 11/28/15  8:15 AM  Result Value Ref Range   AST 16 15 - 41 U/L  BUN     Status: None   Collection Time: 11/28/15  8:15 AM  Result Value Ref Range   BUN 12 6 - 20 mg/dL  Creatinine, serum     Status: None   Collection Time: 11/28/15  8:15 AM  Result Value Ref Range   Creatinine, Ser 0.65 0.44 - 1.00 mg/dL   GFR calc non Af Amer >60 >60 mL/min   GFR calc Af Amer >60 >60 mL/min    Comment: (NOTE) The eGFR has been calculated using the CKD EPI equation. This calculation has not been validated in all clinical situations. eGFR's persistently <60 mL/min signify possible Chronic Kidney Disease.    US Ob Comp Less 14 Wks  11/28/2015  CLINICAL DATA:  29 year old pregnant female presenting with 3 days of vaginal bleeding. Quantitative beta HCG 1,967. EDC by LMP: 07/31/2016, projecting to an expected gestational age of [redacted] weeks 6 days. EXAM: OBSTETRIC <14 WK [place] AND TRANSVAGINAL OB US TECHNIQUE: Both transabdominal and transvaginal ultrasound examinations were performed for complete evaluation of the gestation as well as the maternal uterus, adnexal regions, and pelvic cul-de-sac. Transvaginal technique was performed to assess early pregnancy. COMPARISON:  No prior scans from this gestation. FINDINGS: The uterus is retroverted and retroflexed. There is no evidence of an intrauterine gestational sac. The bilayer endometrial thickness is 12 mm. There is trace fluid in the endometrial  cavity, with no focal endometrial bleed or mass demonstrated. There is an intramural 2.2 x 1.8 x 1.9 cm fibroid in the left anterior lower uterine body. There is a separate subserosal 1.6 x 1.5 x 1.4 cm uterine fibroid in the anterior lower uterine segment. The right ovary measures 2.9 x 2.3 x 3.0 cm and is normal. No right ovarian or right adnexal masses. The left ovary measures 3.2 x 1.5 x 2.6 cm and contains a 1.6 cm corpus luteum. Between the left  ovary and uterus, there is a separate thick walled 1.7 x 1.5 x 2.0 cm mass with peripheral vascularity and central cystic region, in keeping with a left tubal ectopic gestational sac. No discrete embryo or embryonic cardiac activity are detected within this suspected left tubal ectopic sac. No adnexal hematoma or abnormal free fluid are demonstrated. IMPRESSION: 1. Left tubal ectopic gestational sac measuring 1.7 x 1.5 x 2.0 cm, with no embryo or embryonic cardiac activity detected within the ectopic sac. No sonographic evidence of tubal rupture. 2. Retroverted uterus. No intrauterine gestational sac. Trace fluid in the endometrial cavity, with no focal endometrial bleed or mass. 3. Small uterine fibroids. These results were called by telephone at the time of interpretation on 11/28/2015 at 12:26 pm to Dr. Anderson Malta [facility] , who verbally acknowledged these results. Electronically Signed   By: Ilona Sorrel M.D.   On: 11/28/2015 12:29   US Ob Transvaginal  11/28/2015  CLINICAL DATA:  29 year old pregnant female presenting with 3 days of vaginal bleeding. Quantitative beta HCG 1,967. EDC by LMP: 07/31/2016, projecting to an expected gestational age of [redacted] weeks 6 days. EXAM: OBSTETRIC <14 WK [place] AND TRANSVAGINAL OB US TECHNIQUE: Both transabdominal and transvaginal ultrasound examinations were performed for complete evaluation of the gestation as well as the maternal uterus, adnexal regions, and pelvic cul-de-sac. Transvaginal technique was performed to assess early pregnancy. COMPARISON:  No prior scans from this gestation. FINDINGS: The uterus is retroverted and retroflexed. There is no evidence of an intrauterine gestational sac. The bilayer endometrial thickness is 12 mm. There is trace fluid in the endometrial cavity, with no focal endometrial bleed or mass demonstrated. There is an intramural 2.2 x 1.8 x 1.9 cm fibroid in the left anterior lower uterine body. There is a separate subserosal 1.6 x 1.5 x 1.4 cm uterine  fibroid in the anterior lower uterine segment. The right ovary measures 2.9 x 2.3 x 3.0 cm and is normal. No right ovarian or right adnexal masses. The left ovary measures 3.2 x 1.5 x 2.6 cm and contains a 1.6 cm corpus luteum. Between the left ovary and uterus, there is a separate thick walled 1.7 x 1.5 x 2.0 cm mass with peripheral vascularity and central cystic region, in keeping with a left tubal ectopic gestational sac. No discrete embryo or embryonic cardiac activity are detected within this suspected left tubal ectopic sac. No adnexal hematoma or abnormal free fluid are demonstrated. IMPRESSION: 1. Left tubal ectopic gestational sac measuring 1.7 x 1.5 x 2.0 cm, with no embryo or embryonic cardiac activity detected within the ectopic sac. No sonographic evidence of tubal rupture. 2. Retroverted uterus. No intrauterine gestational sac. Trace fluid in the endometrial cavity, with no focal endometrial bleed or mass. 3. Small uterine fibroids. These results were called by telephone at the time of interpretation on 11/28/2015 at 12:26 pm to Dr. Anderson Malta [facility] , who verbally acknowledged these results. Electronically Signed   By: Ilona Sorrel M.D.   On: 11/28/2015  12:29    Review of Systems  Constitutional: Negative for fever and chills.  Gastrointestinal: Positive for nausea, vomiting and abdominal pain.  Genitourinary: Negative for dysuria and urgency.   Physical Exam   Blood pressure 129/77, pulse 90, temperature 98 F (36.7 C), temperature source Oral, resp. rate 18, height '5\' 6"'  (1.676 m), weight 144 lb (65.318 kg), last menstrual period 10/25/2015, SpO2 100 %.  Physical Exam  Constitutional: She is oriented to person, place, and time. She appears well-developed and well-nourished. No distress.  Patient is tearful.   HENT:  Head: Normocephalic.  Genitourinary:  Speculum exam: Vagina - Small amount of dark red blood noted in the posterior vaginal canal.  Cervix - + active bleeding from os.   Bimanual exam: Cervix closed, no CMT  Uterus non tender, normal size Adnexa non tender, no masses bilaterally Chaperone present for exam.  Musculoskeletal: Normal range of motion.  Neurological: She is alert and oriented to person, place, and time.  Skin: Skin is warm. She is not diaphoretic.  Psychiatric: Her behavior is normal.    MAU Course  Procedures  None  MDM  O positive blood type Toradol 60 mg IM given prior to [place] Discussed [place] report with Dr. Corinna Capra who would like the patient to be offered MTX for treatment of ectopic pregnancy.  Patient counseled on the risks of ectopic pregnancy including the risk of tubal rupture without treatment. I recommend treatment today with methotrexate IM injection in which the patient is agreeable to.  MTX given Vicodin 1 tab given PO per patient request.   Assessment and Plan   A:  1. Ectopic pregnancy, tubal   2. Vaginal bleeding in pregnancy, first trimester    P:  Discharge home in stable condition Return on Sunday for Day 4 hcg level Strict ectopic precautions Return to MAU if symptoms worsen Pelvic rest Support given   Lezlie Lye, NP 11/28/2015 4:56 PM

## 2015-11-28 NOTE — MAU Note (Signed)
C/o increased pain and brownish spotting @ 2200 last night; hx of miscarriage,terminationa and loss at 46 days; crying and upset; around 4066w6d;

## 2015-11-28 NOTE — MAU Note (Signed)
Pt reports some spotting yesterday and was seen in the office to rule out miscarriage , BHCG's were rising but last pm pt reports increase in pain, and brownish discharge this am.

## 2015-12-01 ENCOUNTER — Inpatient Hospital Stay (HOSPITAL_COMMUNITY)
Admission: AD | Admit: 2015-12-01 | Discharge: 2015-12-01 | Disposition: A | Discharge status: Home / Self Care | Payer: 59 | Source: Ambulatory Visit | Attending: Obstetrics and Gynecology | Admitting: Obstetrics and Gynecology

## 2015-12-01 DIAGNOSIS — O009 Unspecified ectopic pregnancy without intrauterine pregnancy: Secondary | ICD-10-CM

## 2015-12-01 LAB — HCG, QUANTITATIVE, PREGNANCY: hCG, Beta Chain, Quant, S: 396 m[IU]/mL — ABNORMAL HIGH (ref <5)

## 2015-12-01 NOTE — MAU Provider Note (Signed)
History     CSN: [id number]  Arrival date and time: 12/01/15 1000  Seen by provider at 1140    No chief complaint on file.  HPI Danielle Ochoa 29 y.o. Comes to MAU today for repeat quant after methotrexate for ectopc pregnancy .  Medication given on Thursday and quant was 1967.  Has had a small amount of vaginal bleeding but the abdominal pain has resolved.  OB History    Gravida Para Term Preterm AB TAB SAB Ectopic Multiple Living   0      Past Medical History  Diagnosis Date  . Medical history non-contributory     Past Surgical History  Procedure Laterality Date  . Induced abortion      Family History  Problem Relation Age of Onset  . Alcohol abuse Neg Hx   . Arthritis Neg Hx   . Asthma Neg Hx   . Birth defects Neg Hx   . Cancer Neg Hx   . COPD Neg Hx   . Depression Neg Hx   . Diabetes Neg Hx   . Drug abuse Neg Hx   . Early death Neg Hx   . Hearing loss Neg Hx   . Heart disease Neg Hx   . Hyperlipidemia Neg Hx   . Hypertension Neg Hx   . Kidney disease Neg Hx   . Learning disabilities Neg Hx   . Mental illness Neg Hx   . Mental retardation Neg Hx   . Miscarriages / Stillbirths Neg Hx   . Stroke Neg Hx   . Vision loss Neg Hx   . Varicose Veins Neg Hx     Social History  Substance Use Topics  . Smoking status: Never Smoker   . Smokeless tobacco: Not on file  . Alcohol Use: Yes    Allergies:  Allergies  Allergen Reactions  . Pineapple Anaphylaxis    Prescriptions prior to admission  Medication Sig Dispense Refill Last Dose  . acetaminophen (TYLENOL) 500 MG tablet Take 1,000 mg by mouth every 6 (six) hours as needed for moderate pain.   11/28/2015 at Unknown time  . Prenatal Vit-Fe Fumarate-FA (PRENATAL MULTIVITAMIN) TABS tablet Take 1 tablet by mouth daily at 12 noon.   Past Week at Unknown time  . Probiotic Product (PROBIOTIC DAILY PO) Take 2 capsules by mouth daily.   Past Week at Unknown time    Review of Systems   Constitutional: Negative for fever.  Gastrointestinal: Negative for nausea, vomiting and abdominal pain.  Genitourinary:       No vaginal discharge. Small amount of vaginal bleeding. No dysuria.   Physical Exam   Blood pressure 121/83, pulse 72, temperature 98.1 F (36.7 C), temperature source Oral, resp. rate 16, last menstrual period 10/25/2015.  Physical Exam  Nursing note and vitals reviewed. Constitutional: She is oriented to person, place, and time. She appears well-developed and well-nourished.  Tearful during discussion.  Very pleased she will not need surgery now.  HENT:  Head: Normocephalic.  Eyes: EOM are normal.  Neck: Neck supple.  Musculoskeletal: Normal range of motion.  Neurological: She is alert and oriented to person, place, and time.  Skin: Skin is warm and dry.  Psychiatric: She has a normal mood and affect.    MAU Course  Procedures Results for orders placed or performed during the hospital encounter of 12/01/15 (from the past 24 hour(s))  hCG, quantitative, pregnancy     Status: Abnormal  Collection Time: 12/01/15 10:20 AM  Result Value Ref Range   hCG, Beta Chain, Quant, S 396 (H) <5 mIU/mL   MDM Consult with Dr. Renaldo FiddlerAdkins at [phone]  Assessment and Plan  Ectopic pregnancy with methotrexate and decreasing quant levels.  Plan Will call the office on Monday and be seen in the office on Wednesday for quant on Day 7 following methotrexate. Advised to return immediately to MAU if she has severe vaginal bleeding or abdominal pain. Continue ectopic precautions.  [name],[name] 12/01/2015, 11:45 AM

## 2015-12-01 NOTE — MAU Note (Signed)
Pt here for for F/U BHCG post MTX day 4.  Denies pain today, small amount of bleeding.

## 2015-12-05 ENCOUNTER — Inpatient Hospital Stay (HOSPITAL_COMMUNITY)
Admission: AD | Admit: 2015-12-05 | Discharge: 2015-12-05 | Disposition: A | Discharge status: Home / Self Care | Payer: 59 | Source: Ambulatory Visit | Attending: Obstetrics & Gynecology | Admitting: Obstetrics & Gynecology

## 2015-12-05 ENCOUNTER — Inpatient Hospital Stay (HOSPITAL_COMMUNITY): Payer: 59

## 2015-12-05 ENCOUNTER — Encounter (HOSPITAL_COMMUNITY): Payer: Self-pay | Admitting: *Deleted

## 2015-12-05 DIAGNOSIS — R109 Unspecified abdominal pain: Secondary | ICD-10-CM | POA: Diagnosis present

## 2015-12-05 DIAGNOSIS — Z3A01 Less than 8 weeks gestation of pregnancy: Secondary | ICD-10-CM | POA: Diagnosis not present

## 2015-12-05 DIAGNOSIS — B9689 Other specified bacterial agents as the cause of diseases classified elsewhere: Secondary | ICD-10-CM

## 2015-12-05 DIAGNOSIS — N949 Unspecified condition associated with female genital organs and menstrual cycle: Secondary | ICD-10-CM | POA: Diagnosis not present

## 2015-12-05 DIAGNOSIS — N760 Acute vaginitis: Secondary | ICD-10-CM

## 2015-12-05 DIAGNOSIS — N9489 Other specified conditions associated with female genital organs and menstrual cycle: Secondary | ICD-10-CM | POA: Diagnosis not present

## 2015-12-05 DIAGNOSIS — O26899 Other specified pregnancy related conditions, unspecified trimester: Secondary | ICD-10-CM

## 2015-12-05 DIAGNOSIS — O26891 Other specified pregnancy related conditions, first trimester: Secondary | ICD-10-CM | POA: Insufficient documentation

## 2015-12-05 LAB — URINALYSIS, ROUTINE W REFLEX MICROSCOPIC
BILIRUBIN URINE: NEGATIVE
GLUCOSE, UA: NEGATIVE mg/dL
Ketones, ur: NEGATIVE mg/dL
Leukocytes, UA: NEGATIVE
Nitrite: NEGATIVE
PROTEIN: NEGATIVE mg/dL
SPECIFIC GRAVITY, URINE: 1.015 (ref 1.005–1.030)
pH: 6 (ref 5.0–8.0)

## 2015-12-05 LAB — WET PREP, GENITAL
SPERM: NONE SEEN
Trich, Wet Prep: NONE SEEN
YEAST WET PREP: NONE SEEN

## 2015-12-05 LAB — URINE MICROSCOPIC-ADD ON

## 2015-12-05 MED ORDER — IBUPROFEN 800 MG PO TABS: 800.0000 mg | ORAL_TABLET | Freq: Three times a day (TID) | ORAL | Status: DC

## 2015-12-05 MED ORDER — METRONIDAZOLE 500 MG PO TABS: 500.0000 mg | ORAL_TABLET | Freq: Two times a day (BID) | ORAL | Status: DC

## 2015-12-05 NOTE — Discharge Instructions (Signed)

## 2015-12-05 NOTE — MAU Provider Note (Signed)
History     CSN: [id number]  Arrival date and time: 12/05/15 0806   None     Chief Complaint  Patient presents with  . Abdominal Pain   HPI   Ms.Danielle Ochoa is a 29 y.o. female [phone] with a poor obstetrical history and recent treatment of left adnexal mass with MTX presents to MAU with left side abdominal pain. She was seen in the office yesterday for day 7 beta hcg level and her quant dropped down to around 100. Her quant was 1900 at the time of diagnoses.   The patient is concerned because her pain remains in her lower abdomen; the pain is not worse however she is worried that she is still having pain on that left side.  She has stopped taking tylenol for pain because she does not want to take anything that will make this worse.   She currently rates her pain 5/10.  OB History    Gravida Para Term Preterm AB TAB SAB Ectopic Multiple Living   0      Past Medical History  Diagnosis Date  . Medical history non-contributory     Past Surgical History  Procedure Laterality Date  . Induced abortion      Family History  Problem Relation Age of Onset  . Alcohol abuse Neg Hx   . Arthritis Neg Hx   . Asthma Neg Hx   . Birth defects Neg Hx   . Cancer Neg Hx   . COPD Neg Hx   . Depression Neg Hx   . Diabetes Neg Hx   . Drug abuse Neg Hx   . Early death Neg Hx   . Hearing loss Neg Hx   . Heart disease Neg Hx   . Hyperlipidemia Neg Hx   . Hypertension Neg Hx   . Kidney disease Neg Hx   . Learning disabilities Neg Hx   . Mental illness Neg Hx   . Mental retardation Neg Hx   . Miscarriages / Stillbirths Neg Hx   . Stroke Neg Hx   . Vision loss Neg Hx   . Varicose Veins Neg Hx     Social History  Substance Use Topics  . Smoking status: Never Smoker   . Smokeless tobacco: None  . Alcohol Use: Yes    Allergies:  Allergies  Allergen Reactions  . Pineapple Anaphylaxis    Prescriptions prior to admission  Medication Sig Dispense Refill  Last Dose  . acetaminophen (TYLENOL) 500 MG tablet Take 1,000 mg by mouth every 6 (six) hours as needed for moderate pain.   Past Week at Unknown time  . Probiotic Product (PROBIOTIC DAILY PO) Take 2 capsules by mouth daily.   12/04/2015 at Unknown time   Results for orders placed or performed during the hospital encounter of 12/05/15 (from the past 48 hour(s))  Urinalysis, Routine w reflex microscopic (not at [facility])     Status: Abnormal   Collection Time: 12/05/15  8:58 AM  Result Value Ref Range   Color, Urine YELLOW YELLOW   APPearance CLEAR CLEAR   Specific Gravity, Urine 1.015 1.005 - 1.030   pH 6.0 5.0 - 8.0   Glucose, UA NEGATIVE NEGATIVE mg/dL   Hgb urine dipstick LARGE (A) NEGATIVE   Bilirubin Urine NEGATIVE NEGATIVE   Ketones, ur NEGATIVE NEGATIVE mg/dL   Protein, ur NEGATIVE NEGATIVE mg/dL   Nitrite NEGATIVE NEGATIVE   Leukocytes, UA NEGATIVE NEGATIVE  Urine  microscopic-add on     Status: Abnormal   Collection Time: 12/05/15  8:58 AM  Result Value Ref Range   Squamous Epithelial / LPF 0-5 (A) NONE SEEN   WBC, UA 0-5 0 - 5 WBC/hpf   RBC / HPF 6-30 0 - 5 RBC/hpf   Bacteria, UA RARE (A) NONE SEEN  Wet prep, genital     Status: Abnormal   Collection Time: 12/05/15  9:03 AM  Result Value Ref Range   Yeast Wet Prep HPF POC NONE SEEN NONE SEEN   Trich, Wet Prep NONE SEEN NONE SEEN   Clue Cells Wet Prep HPF POC PRESENT (A) NONE SEEN   WBC, Wet Prep HPF POC FEW (A) NONE SEEN    Comment: MANY BACTERIA SEEN   Sperm NONE SEEN    US Ob Transvaginal  12/05/2015  CLINICAL DATA:  Persistent abdominal pain and vaginal bleeding. Left-sided ectopic pregnancy treated with methotrexate. EXAM: TRANSVAGINAL OB ULTRASOUND TECHNIQUE: Transvaginal ultrasound was performed for complete evaluation of the gestation as well as the maternal uterus, adnexal regions, and pelvic cul-de-sac. COMPARISON:  11/28/2015 FINDINGS: Uterus is retroverted. No intrauterine gestational sac is seen. Two small  subserosal fibroids again seen in the anterior corpus measuring 1.5 cm and 2.0 cm in maximum diameter. Right ovary is normal appearance and no right-sided adnexal mass identified. There is a left adnexal mass which is located superior to the left ovary, and is again seen to contain a small central sac-like structure. This currently measures 4.3 x 2.4 x 3.3 cm, and is increased in size compared to previous study when it measured 1.7 x 1.5 x 2.0 cm. No abnormal free-fluid demonstrated. IMPRESSION: Increased size of 4.3 cm left adnexal mass since prior exam, consistent with ectopic pregnancy. No evidence of hemoperitoneum. Stable small uterine fibroids. Critical Value/emergent results were called by telephone at the time of interpretation on 12/05/2015 at 10:58 am to Dr. Victorino Dike [facility] , who verbally acknowledged these results. Electronically Signed   By: Myles Rosenthal M.D.   On: 12/05/2015 11:02    Review of Systems  Constitutional: Negative for fever and chills.  Gastrointestinal: Positive for abdominal pain. Negative for nausea and vomiting.   Physical Exam   Blood pressure 119/82, pulse 69, temperature 97.8 F (36.6 C), temperature source Oral, resp. rate 18, last menstrual period 10/25/2015.  Physical Exam  Constitutional: She is oriented to person, place, and time. She appears well-developed and well-nourished. No distress.  HENT:  Head: Normocephalic.  Eyes: Pupils are equal, round, and reactive to light.  Respiratory: Effort normal.  GI: Normal appearance. There is tenderness in the suprapubic area and left lower quadrant. There is rigidity. There is no rebound and no guarding.  Genitourinary:  Wet prep collected without speculum exam by RN  Musculoskeletal: Normal range of motion.  Neurological: She is alert and oriented to person, place, and time.  Skin: Skin is warm. She is not diaphoretic.  Psychiatric: Her behavior is normal.    MAU Course  Procedures  None  MDM  Discussed  patient arrival with Dr. Langston Masker @ 0930. She would like the patient to go for an [place] to evaluate the left adnexal mass.   [place] report discussed with Dr. Langston Masker,  Ectopic has increased in size from 1.7 cm to 4.3 cm without evidence of free fluid today. Beta hcg levels have been decreasing. Dr. Langston Masker will come to MAU to see the patient to determine plan of care.   Assessment and Plan  A:  1. Adnexal mass   2. Abdominal pain in pregnancy, antepartum    P:  Dr. Langston MaskerMorris in MAU at the bedside.  Patient discharged home with strict ectopic precautions; second dose of MTX not recommended by Dr. Langston MaskerMorris at this time.  Ibuprofen for pain.  RX: Flagyl    Duane LopeJennifer I Marlowe Cinquemani, NP 12/05/2015 7:54 PM

## 2015-12-05 NOTE — MAU Note (Signed)
Having severe abd pain, pressure in lower abd. Tylenol is not helping. Bleeding continues, was heavier yesterday and passed some clots

## 2015-12-05 NOTE — MAU Note (Addendum)
C/o increased pain and pressure; bleeding was heavier yesterday and did have a few large clot; c/o loss of appetite; having diarrhea for past 2 days; had ectopic pregnancy and had methotrexate on thurs; concerned about STDs causing ectopic pregnancy;

## 2016-06-15 ENCOUNTER — Other Ambulatory Visit (HOSPITAL_COMMUNITY): Payer: Self-pay | Admitting: Obstetrics & Gynecology

## 2016-06-15 DIAGNOSIS — Z8759 Personal history of other complications of pregnancy, childbirth and the puerperium: Secondary | ICD-10-CM

## 2016-06-24 ENCOUNTER — Encounter (HOSPITAL_COMMUNITY): Payer: Self-pay

## 2016-06-24 ENCOUNTER — Ambulatory Visit (HOSPITAL_COMMUNITY)
Admission: RE | Admit: 2016-06-24 | Discharge: 2016-06-24 | Disposition: A | Discharge status: Home / Self Care | Payer: BLUE CROSS/BLUE SHIELD | Source: Ambulatory Visit | Attending: Obstetrics & Gynecology | Admitting: Obstetrics & Gynecology

## 2016-06-24 DIAGNOSIS — N9489 Other specified conditions associated with female genital organs and menstrual cycle: Secondary | ICD-10-CM | POA: Diagnosis not present

## 2016-06-24 DIAGNOSIS — Z8759 Personal history of other complications of pregnancy, childbirth and the puerperium: Secondary | ICD-10-CM

## 2016-06-24 MED ORDER — IOPAMIDOL (ISOVUE-300) INJECTION 61%
30.0000 mL | Freq: Once | INTRAVENOUS | Status: AC | PRN
  Administered 2016-06-24: 30 mL

## 2016-06-30 DIAGNOSIS — R1032 Left lower quadrant pain: Secondary | ICD-10-CM | POA: Diagnosis not present

## 2016-07-21 DIAGNOSIS — H65192 Other acute nonsuppurative otitis media, left ear: Secondary | ICD-10-CM | POA: Diagnosis not present

## 2016-07-21 DIAGNOSIS — H60392 Other infective otitis externa, left ear: Secondary | ICD-10-CM | POA: Diagnosis not present

## 2016-08-12 DIAGNOSIS — Z3200 Encounter for pregnancy test, result unknown: Secondary | ICD-10-CM | POA: Diagnosis not present

## 2016-08-12 DIAGNOSIS — N760 Acute vaginitis: Secondary | ICD-10-CM | POA: Diagnosis not present

## 2016-08-12 DIAGNOSIS — Z113 Encounter for screening for infections with a predominantly sexual mode of transmission: Secondary | ICD-10-CM | POA: Diagnosis not present

## 2016-08-13 ENCOUNTER — Inpatient Hospital Stay (HOSPITAL_COMMUNITY)
Admission: AD | Admit: 2016-08-13 | Discharge: 2016-08-13 | Disposition: A | Discharge status: Home / Self Care | Payer: BLUE CROSS/BLUE SHIELD | Source: Ambulatory Visit | Attending: Obstetrics and Gynecology | Admitting: Obstetrics and Gynecology

## 2016-08-13 ENCOUNTER — Encounter (HOSPITAL_COMMUNITY): Payer: Self-pay

## 2016-08-13 DIAGNOSIS — R102 Pelvic and perineal pain: Secondary | ICD-10-CM | POA: Insufficient documentation

## 2016-08-13 LAB — URINALYSIS, ROUTINE W REFLEX MICROSCOPIC
BILIRUBIN URINE: NEGATIVE
Glucose, UA: NEGATIVE mg/dL
Hgb urine dipstick: NEGATIVE
KETONES UR: NEGATIVE mg/dL
Leukocytes, UA: NEGATIVE
Nitrite: NEGATIVE
PH: 6 (ref 5.0–8.0)
PROTEIN: NEGATIVE mg/dL
Specific Gravity, Urine: 1.01 (ref 1.005–1.030)

## 2016-08-13 LAB — WET PREP, GENITAL
SPERM: NONE SEEN
Trich, Wet Prep: NONE SEEN
Yeast Wet Prep HPF POC: NONE SEEN

## 2016-08-13 LAB — POCT PREGNANCY, URINE: Preg Test, Ur: NEGATIVE

## 2016-08-13 NOTE — MAU Provider Note (Signed)
MAU HISTORY AND PHYSICAL  Chief Complaint: pelvic pain  Danielle Ochoa is a 30 y.o.  [phone] presenting for above.   begain one week ago. Feels like what had when had ectopic pregnancy. A tugging/tighhtening sensation mid pelvis. Comes and goes. Nothing seems to provoke. Staying the same. Nauseaus today, no vomiting. On amoxicillin for an ear infection. Having loose stools with that. Ate today. No dysuria. Subjective chills last night, no fevers. Last period 8/1. No history dysmenorrhea. No vaginal bleeding. Evaluated in clinic yesterday. Says had std testing. Told has bv. Sexually active, female partners. Hx std 8 yrs ago, unsure what.  Past Medical History:  Diagnosis Date  . Medical history non-contributory     Past Surgical History:  Procedure Laterality Date  . INDUCED ABORTION      Family History  Problem Relation Age of Onset  . Alcohol abuse Neg Hx   . Arthritis Neg Hx   . Asthma Neg Hx   . Birth defects Neg Hx   . Cancer Neg Hx   . COPD Neg Hx   . Depression Neg Hx   . Diabetes Neg Hx   . Drug abuse Neg Hx   . Early death Neg Hx   . Hearing loss Neg Hx   . Heart disease Neg Hx   . Hyperlipidemia Neg Hx   . Hypertension Neg Hx   . Kidney disease Neg Hx   . Learning disabilities Neg Hx   . Mental illness Neg Hx   . Mental retardation Neg Hx   . Miscarriages / Stillbirths Neg Hx   . Stroke Neg Hx   . Vision loss Neg Hx   . Varicose Veins Neg Hx     Social History  Substance Use Topics  . Smoking status: Never Smoker  . Smokeless tobacco: Never Used  . Alcohol use Yes     Comment: Social    Allergies  Allergen Reactions  . Pineapple Anaphylaxis    Prescriptions Prior to Admission  Medication Sig Dispense Refill Last Dose  . acetaminophen (TYLENOL) 500 MG tablet Take 1,000 mg by mouth every 6 (six) hours as needed for moderate pain.   08/13/2016 at Unknown time  . amoxicillin-clavulanate (AUGMENTIN) 875-125 MG tablet Take 1 tablet by mouth once.   Past  Week at Unknown time  . ibuprofen (ADVIL,MOTRIN) 200 MG tablet Take 800 mg by mouth every 6 (six) hours as needed for moderate pain.   08/13/2016 at Unknown time  . ibuprofen (ADVIL,MOTRIN) 800 MG tablet Take 1 tablet (800 mg total) by mouth 3 (three) times daily. (Patient not taking: Reported on 08/13/2016) 21 tablet 0 Not Taking at Unknown time    Review of Systems - Negative except for what is mentioned in HPI.  Physical Exam  Blood pressure 118/74, pulse 74, temperature 98.2 F (36.8 C), resp. rate 18, last menstrual period 07/21/2016, unknown if currently breastfeeding. GENERAL: Well-developed, well-nourished female in no acute distress.  LUNGS: Clear to auscultation bilaterally.  HEART: Regular rate and rhythm. ABDOMEN: Soft, nontender, nondistended  EXTREMITIES: Nontender, no edema, 2+ distal pulses. GU: normal vagina, normal cervix, scant white discharge, no cmt, no adnexal tenderness/fullness   Labs: Results for orders placed or performed during the hospital encounter of 08/13/16 (from the past 24 hour(s))  Urinalysis, Routine w reflex microscopic [facility])   Collection Time: 08/13/16  5:00 PM  Result Value Ref Range   Color, Urine YELLOW YELLOW   APPearance CLEAR CLEAR   Specific Gravity,  Urine 1.010 1.005 - 1.030   pH 6.0 5.0 - 8.0   Glucose, UA NEGATIVE NEGATIVE mg/dL   Hgb urine dipstick NEGATIVE NEGATIVE   Bilirubin Urine NEGATIVE NEGATIVE   Ketones, ur NEGATIVE NEGATIVE mg/dL   Protein, ur NEGATIVE NEGATIVE mg/dL   Nitrite NEGATIVE NEGATIVE   Leukocytes, UA NEGATIVE NEGATIVE  Pregnancy, urine POC   Collection Time: 08/13/16  6:32 PM  Result Value Ref Range   Preg Test, Ur NEGATIVE NEGATIVE  Wet prep, genital   Collection Time: 08/13/16  6:47 PM  Result Value Ref Range   Yeast Wet Prep HPF POC NONE SEEN NONE SEEN   Trich, Wet Prep NONE SEEN NONE SEEN   Clue Cells Wet Prep HPF POC PRESENT (A) NONE SEEN   WBC, Wet Prep HPF POC FEW (A) NONE SEEN   Sperm NONE  SEEN     Imaging Studies:  No results found.  Assessment: Danielle Ochoa is  30 y.o. [phone] presents with pelvic pain. Etiology is unclear, but reassuringly pain is mild, patient is very well appearing, with normal vitals, no associated symptoms, and normal exam. upt negative. Main reason for visit to ed today was to eval for pid. G/c obtained yesterday at pcp's office and reportedly negative. Here afebrile, no n/v, well appearing, normal cervix, and no cmt or adnexal tenderness. Reportedly normal labwork at pcp yesterday as well. Discussed w/ dr. Marcelle Overlie.  Plan: - ob clinic f/u to continue w/u if pain persists - pelvic pain ed return precautions discussed  Silvano Bilis 8/24/20177:44 PM

## 2016-08-13 NOTE — MAU Note (Signed)
Pt presents to MAU with complaints of thick white vaginal discharge with lower abdominal discomfort.

## 2016-08-13 NOTE — Discharge Instructions (Signed)

## 2016-08-17 DIAGNOSIS — N7011 Chronic salpingitis: Secondary | ICD-10-CM | POA: Diagnosis not present

## 2016-08-17 DIAGNOSIS — Z3161 Procreative counseling and advice using natural family planning: Secondary | ICD-10-CM | POA: Diagnosis not present

## 2016-08-17 DIAGNOSIS — Z319 Encounter for procreative management, unspecified: Secondary | ICD-10-CM | POA: Diagnosis not present

## 2016-08-21 DIAGNOSIS — Z319 Encounter for procreative management, unspecified: Secondary | ICD-10-CM | POA: Diagnosis not present

## 2017-01-28 DIAGNOSIS — N760 Acute vaginitis: Secondary | ICD-10-CM | POA: Diagnosis not present

## 2017-01-28 DIAGNOSIS — Z113 Encounter for screening for infections with a predominantly sexual mode of transmission: Secondary | ICD-10-CM | POA: Diagnosis not present

## 2017-03-22 DIAGNOSIS — Z6821 Body mass index (BMI) 21.0-21.9, adult: Secondary | ICD-10-CM | POA: Diagnosis not present

## 2017-03-22 DIAGNOSIS — Z01419 Encounter for gynecological examination (general) (routine) without abnormal findings: Secondary | ICD-10-CM | POA: Diagnosis not present

## 2017-09-09 DIAGNOSIS — Z113 Encounter for screening for infections with a predominantly sexual mode of transmission: Secondary | ICD-10-CM | POA: Diagnosis not present

## 2017-09-09 DIAGNOSIS — R102 Pelvic and perineal pain: Secondary | ICD-10-CM | POA: Diagnosis not present

## 2017-10-29 DIAGNOSIS — Z00110 Health examination for newborn under 8 days old: Secondary | ICD-10-CM | POA: Diagnosis not present

## 2017-11-03 DIAGNOSIS — Z00111 Health examination for newborn 8 to 28 days old: Secondary | ICD-10-CM | POA: Diagnosis not present

## 2017-12-27 ENCOUNTER — Other Ambulatory Visit (HOSPITAL_COMMUNITY)
Admission: RE | Admit: 2017-12-27 | Discharge: 2017-12-27 | Disposition: A | Discharge status: Home / Self Care | Payer: BLUE CROSS/BLUE SHIELD | Source: Ambulatory Visit | Attending: Obstetrics and Gynecology | Admitting: Obstetrics and Gynecology

## 2017-12-27 ENCOUNTER — Other Ambulatory Visit: Payer: Self-pay | Admitting: Obstetrics and Gynecology

## 2017-12-27 DIAGNOSIS — Z124 Encounter for screening for malignant neoplasm of cervix: Secondary | ICD-10-CM | POA: Insufficient documentation

## 2017-12-27 DIAGNOSIS — Z3169 Encounter for other general counseling and advice on procreation: Secondary | ICD-10-CM | POA: Diagnosis not present

## 2017-12-27 DIAGNOSIS — Z01411 Encounter for gynecological examination (general) (routine) with abnormal findings: Secondary | ICD-10-CM | POA: Diagnosis not present

## 2017-12-27 DIAGNOSIS — O00102 Left tubal pregnancy without intrauterine pregnancy: Secondary | ICD-10-CM | POA: Diagnosis not present

## 2017-12-27 DIAGNOSIS — N898 Other specified noninflammatory disorders of vagina: Secondary | ICD-10-CM | POA: Diagnosis not present

## 2017-12-27 DIAGNOSIS — Z113 Encounter for screening for infections with a predominantly sexual mode of transmission: Secondary | ICD-10-CM | POA: Diagnosis not present

## 2017-12-29 LAB — CYTOLOGY - PAP
Chlamydia: NEGATIVE
Diagnosis: UNDETERMINED — AB
HPV: DETECTED — AB
Neisseria Gonorrhea: NEGATIVE

## 2018-01-03 ENCOUNTER — Other Ambulatory Visit (HOSPITAL_COMMUNITY): Payer: Self-pay | Admitting: Obstetrics and Gynecology

## 2018-01-03 DIAGNOSIS — N979 Female infertility, unspecified: Secondary | ICD-10-CM

## 2018-01-10 ENCOUNTER — Ambulatory Visit (HOSPITAL_COMMUNITY)
Admission: RE | Admit: 2018-01-10 | Discharge: 2018-01-10 | Disposition: A | Discharge status: Home / Self Care | Payer: BLUE CROSS/BLUE SHIELD | Source: Ambulatory Visit | Attending: Obstetrics and Gynecology | Admitting: Obstetrics and Gynecology

## 2018-01-10 DIAGNOSIS — N979 Female infertility, unspecified: Secondary | ICD-10-CM

## 2018-01-10 DIAGNOSIS — N854 Malposition of uterus: Secondary | ICD-10-CM | POA: Diagnosis not present

## 2018-01-10 DIAGNOSIS — Z8759 Personal history of other complications of pregnancy, childbirth and the puerperium: Secondary | ICD-10-CM | POA: Diagnosis not present

## 2018-01-10 MED ORDER — IOPAMIDOL (ISOVUE-300) INJECTION 61%
30.0000 mL | Freq: Once | INTRAVENOUS | Status: AC | PRN
  Administered 2018-01-10: 30 mL

## 2018-02-03 DIAGNOSIS — R102 Pelvic and perineal pain: Secondary | ICD-10-CM | POA: Diagnosis not present

## 2018-02-14 ENCOUNTER — Other Ambulatory Visit: Payer: Self-pay | Admitting: Obstetrics and Gynecology

## 2018-02-14 DIAGNOSIS — D260 Other benign neoplasm of cervix uteri: Secondary | ICD-10-CM | POA: Diagnosis not present

## 2018-02-14 DIAGNOSIS — Z3202 Encounter for pregnancy test, result negative: Secondary | ICD-10-CM | POA: Diagnosis not present

## 2018-02-14 DIAGNOSIS — R87810 Cervical high risk human papillomavirus (HPV) DNA test positive: Secondary | ICD-10-CM | POA: Diagnosis not present

## 2018-02-14 DIAGNOSIS — N72 Inflammatory disease of cervix uteri: Secondary | ICD-10-CM | POA: Diagnosis not present

## 2018-03-31 DIAGNOSIS — N83201 Unspecified ovarian cyst, right side: Secondary | ICD-10-CM | POA: Diagnosis not present

## 2018-05-30 ENCOUNTER — Inpatient Hospital Stay (HOSPITAL_COMMUNITY)
Admission: AD | Admit: 2018-05-30 | Discharge: 2018-05-30 | Discharge status: Against Medical Advice | Payer: BLUE CROSS/BLUE SHIELD | Source: Ambulatory Visit | Attending: Obstetrics and Gynecology | Admitting: Obstetrics and Gynecology

## 2018-05-30 ENCOUNTER — Encounter (HOSPITAL_COMMUNITY): Payer: Self-pay | Admitting: *Deleted

## 2018-05-30 DIAGNOSIS — N939 Abnormal uterine and vaginal bleeding, unspecified: Secondary | ICD-10-CM

## 2018-05-30 DIAGNOSIS — Z5321 Procedure and treatment not carried out due to patient leaving prior to being seen by health care provider: Secondary | ICD-10-CM | POA: Insufficient documentation

## 2018-05-30 DIAGNOSIS — Z5329 Procedure and treatment not carried out because of patient's decision for other reasons: Secondary | ICD-10-CM

## 2018-05-30 DIAGNOSIS — Z5320 Procedure and treatment not carried out because of patient's decision for unspecified reasons: Secondary | ICD-10-CM

## 2018-05-30 LAB — URINALYSIS, ROUTINE W REFLEX MICROSCOPIC
BACTERIA UA: NONE SEEN
Bilirubin Urine: NEGATIVE
Glucose, UA: NEGATIVE mg/dL
KETONES UR: NEGATIVE mg/dL
LEUKOCYTES UA: NEGATIVE
Nitrite: NEGATIVE
PH: 5 (ref 5.0–8.0)
Protein, ur: NEGATIVE mg/dL
Specific Gravity, Urine: 1.006 (ref 1.005–1.030)

## 2018-05-30 LAB — POCT PREGNANCY, URINE: PREG TEST UR: NEGATIVE

## 2018-05-30 NOTE — MAU Note (Signed)
Pt had ectopic pregnancy two years ago, feels the same way now.  Has been spotting since Saturday, having severe pelvic pressure & back pain.  Is expecting her period in 4 days, but usually doesn't feel this way.

## 2018-06-02 DIAGNOSIS — R10819 Abdominal tenderness, unspecified site: Secondary | ICD-10-CM | POA: Diagnosis not present

## 2018-06-02 DIAGNOSIS — Z113 Encounter for screening for infections with a predominantly sexual mode of transmission: Secondary | ICD-10-CM | POA: Diagnosis not present

## 2018-06-09 DIAGNOSIS — R10819 Abdominal tenderness, unspecified site: Secondary | ICD-10-CM | POA: Diagnosis not present

## 2018-09-27 ENCOUNTER — Other Ambulatory Visit (HOSPITAL_COMMUNITY)
Admission: RE | Admit: 2018-09-27 | Discharge: 2018-09-27 | Disposition: A | Discharge status: Home / Self Care | Payer: BLUE CROSS/BLUE SHIELD | Source: Ambulatory Visit | Attending: Obstetrics and Gynecology | Admitting: Obstetrics and Gynecology

## 2018-09-27 ENCOUNTER — Other Ambulatory Visit: Payer: Self-pay | Admitting: Obstetrics and Gynecology

## 2018-09-27 DIAGNOSIS — R87610 Atypical squamous cells of undetermined significance on cytologic smear of cervix (ASC-US): Secondary | ICD-10-CM | POA: Diagnosis not present

## 2018-09-27 DIAGNOSIS — R87810 Cervical high risk human papillomavirus (HPV) DNA test positive: Secondary | ICD-10-CM | POA: Diagnosis not present

## 2018-09-29 LAB — CYTOLOGY - PAP: DIAGNOSIS: NEGATIVE

## 2021-05-26 ENCOUNTER — Emergency Department: Payer: Self-pay

## 2021-05-26 ENCOUNTER — Other Ambulatory Visit: Payer: Self-pay

## 2021-05-26 ENCOUNTER — Emergency Department
Admission: EM | Admit: 2021-05-26 | Discharge: 2021-05-26 | Disposition: A | Discharge status: Home / Self Care | Payer: Self-pay | Attending: Emergency Medicine | Admitting: Emergency Medicine

## 2021-05-26 DIAGNOSIS — R519 Headache, unspecified: Secondary | ICD-10-CM | POA: Insufficient documentation

## 2021-05-26 NOTE — ED Triage Notes (Signed)
Pt here with pain behind her right eye that pulsates. Pt has a hx of HA but it has gotten worse within the last 2 weeks.

## 2021-05-26 NOTE — ED Triage Notes (Signed)
First Nurse Note:  C/O right sided headache x 2 days.  Patient states she has had history of ongoing headaches.  Mother recently diagnosed with a brain tumor and patient was encouraged to come to ED for a MRI.  AAOx3.  Skin warm and dry. MAE equally and strong.  Gait steady.  Posture upright and relaxed. NAD

## 2021-05-26 NOTE — ED Provider Notes (Signed)
[facility] Emergency Department Provider Note   ____________________________________________   Event Date/Time   First MD Initiated Contact with Patient 05/26/21 1346     (approximate)  I have reviewed the triage vital signs and the nursing notes.   HISTORY  Chief Complaint Headache    HPI Danielle Ochoa is a 35 y.o. female with no significant past medical history who presents to the ED complaining of headaches.  Patient reports that she has been dealing with headaches for many years but it has gotten more severe over the past 2 weeks.  She describes a headache today that is different than her prior episodes and much more severe than usual.  She describes it as a throbbing on the right side of her head with associated pressure behind her right eye.  She denies any vision changes, speech changes, numbness, or weakness.  She has not had any fevers, neck stiffness, nausea, or vomiting.  She is concerned because her mother was recently diagnosed with a brain tumor.        Past Medical History:  Diagnosis Date  . Medical history non-contributory     There are no problems to display for this patient.   Past Surgical History:  Procedure Laterality Date  . INDUCED ABORTION      Prior to Admission medications   Medication Sig Start Date End Date Taking? Authorizing Provider  ibuprofen (ADVIL,MOTRIN) 200 MG tablet Take 800 mg by mouth every 6 (six) hours as needed for moderate pain.    [provider]  Multiple Vitamin (MULTIVITAMIN WITH MINERALS) TABS tablet Take 1 tablet by mouth daily.    [provider]    Allergies Pineapple  Family History  Problem Relation Age of Onset  . Alcohol abuse Neg Hx   . Arthritis Neg Hx   . Asthma Neg Hx   . Birth defects Neg Hx   . Cancer Neg Hx   . COPD Neg Hx   . Depression Neg Hx   . Diabetes Neg Hx   . Drug abuse Neg Hx   . Early death Neg Hx   . Hearing loss Neg Hx   . Heart  disease Neg Hx   . Hyperlipidemia Neg Hx   . Hypertension Neg Hx   . Kidney disease Neg Hx   . Learning disabilities Neg Hx   . Mental illness Neg Hx   . Mental retardation Neg Hx   . Miscarriages / Stillbirths Neg Hx   . Stroke Neg Hx   . Vision loss Neg Hx   . Varicose Veins Neg Hx     Social History Social History   Tobacco Use  . Smoking status: Never Smoker  . Smokeless tobacco: Never Used  Substance Use Topics  . Alcohol use: Yes    Comment: Social  . Drug use: No    Review of Systems  Constitutional: No fever/chills Eyes: No visual changes. ENT: No sore throat. Cardiovascular: Denies chest pain. Respiratory: Denies shortness of breath. Gastrointestinal: No abdominal pain.  No nausea, no vomiting.  No diarrhea.  No constipation. Genitourinary: Negative for dysuria. Musculoskeletal: Negative for back pain. Skin: Negative for rash. Neurological: Positive for headaches, negative for focal weakness or numbness.  ____________________________________________   PHYSICAL EXAM:  VITAL SIGNS: ED Triage Vitals  Enc Vitals Group     BP 05/26/21 1314 136/88     Pulse Rate 05/26/21 1314 (!) 105     Resp 05/26/21 1314 20  Temp 05/26/21 1314 98 F (36.7 C)     Temp Source 05/26/21 1314 Oral     SpO2 05/26/21 1314 99 %     Weight 05/26/21 1315 132 lb (59.9 kg)     Height 05/26/21 1315 5\' 6"  (1.676 m)     Head Circumference --      Peak Flow --      Pain Score 05/26/21 1315 4     Pain Loc --      Pain Edu? --      Excl. in GC? --     Constitutional: Alert and oriented. Eyes: Conjunctivae are normal. Head: Atraumatic. Nose: No congestion/rhinnorhea. Mouth/Throat: Mucous membranes are moist. Neck: Normal ROM Cardiovascular: Normal rate, regular rhythm. Grossly normal heart sounds. Respiratory: Normal respiratory effort.  No retractions. Lungs CTAB. Gastrointestinal: Soft and nontender. No distention. Genitourinary: deferred Musculoskeletal: No lower  extremity tenderness nor edema. Neurologic:  Normal speech and language. No gross focal neurologic deficits are appreciated. Skin:  Skin is warm, dry and intact. No rash noted. Psychiatric: Mood and affect are normal. Speech and behavior are normal.  ____________________________________________   LABS (all labs ordered are listed, but only abnormal results are displayed)  Labs Reviewed - No data to display   PROCEDURES  Procedure(s) performed (including Critical Care):  Procedures   ____________________________________________   INITIAL IMPRESSION / ASSESSMENT AND PLAN / ED COURSE       35 year old female with no significant past medical history presents to the ED with acute on chronic headache that she now describes as severe and affecting the right side of her head.  She is primarily concerned about the possibility of a tumor given her mother's recent diagnosis.  I overall have low suspicion for malignancy or SAH given chronic nature of her headaches, she also has no focal neurologic deficits on exam.  We will perform CT head and patient request, she declines any medication for headache at this time.  If CT head is unremarkable, patient would be appropriate for discharge home with neurology follow-up.  CT head reviewed by me with no obvious hemorrhage, negative for acute process per radiology.  Patient is appropriate for discharge home with neurology follow-up, was counseled to return to the ED for new or worsening symptoms.  Patient agrees with plan.      ____________________________________________   FINAL CLINICAL IMPRESSION(S) / ED DIAGNOSES  Final diagnoses:  Acute nonintractable headache, unspecified headache type     ED Discharge Orders    None       Note:  This document was prepared using Dragon voice recognition software and may include unintentional dictation errors.   31, MD 05/26/21 1520

## 2021-12-10 DIAGNOSIS — E669 Obesity, unspecified: Secondary | ICD-10-CM | POA: Diagnosis not present

## 2021-12-10 DIAGNOSIS — Z01419 Encounter for gynecological examination (general) (routine) without abnormal findings: Secondary | ICD-10-CM | POA: Diagnosis not present

## 2021-12-10 DIAGNOSIS — Z130 Encounter for screening for diseases of the blood and blood-forming organs and certain disorders involving the immune mechanism: Secondary | ICD-10-CM | POA: Diagnosis not present

## 2021-12-10 DIAGNOSIS — Z124 Encounter for screening for malignant neoplasm of cervix: Secondary | ICD-10-CM | POA: Diagnosis not present

## 2021-12-30 DIAGNOSIS — R1032 Left lower quadrant pain: Secondary | ICD-10-CM | POA: Diagnosis not present

## 2021-12-30 DIAGNOSIS — Z09 Encounter for follow-up examination after completed treatment for conditions other than malignant neoplasm: Secondary | ICD-10-CM | POA: Diagnosis not present

## 2022-01-02 DIAGNOSIS — Z113 Encounter for screening for infections with a predominantly sexual mode of transmission: Secondary | ICD-10-CM | POA: Diagnosis not present

## 2022-02-23 IMAGING — CT CT HEAD W/O CM
2 series · 15 of 35 positions shown, 18 images · non-contrast
Comparison: None.

CLINICAL DATA: Headache, intracranial hemorrhage suspected

EXAM:
CT HEAD WITHOUT CONTRAST
TECHNIQUE: Contiguous axial images were obtained from the base of the skull
through the vertex without intravenous contrast.

[Series 3: head wo · axial · 0.40mm/px · z∈[-85,+30]mm · 12 of 28 slices shown, 15 images]
[im 3/28  brain]
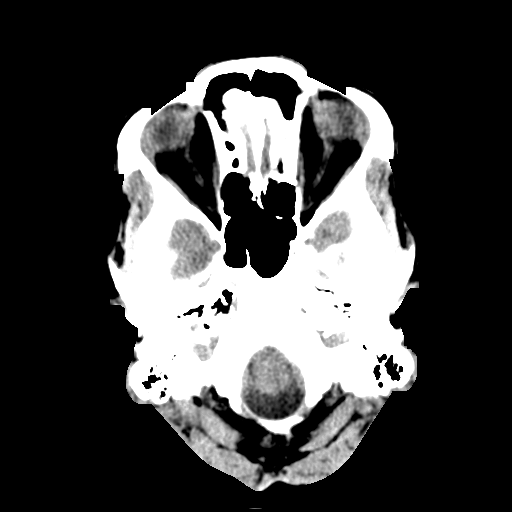
[im 3/28  bone]
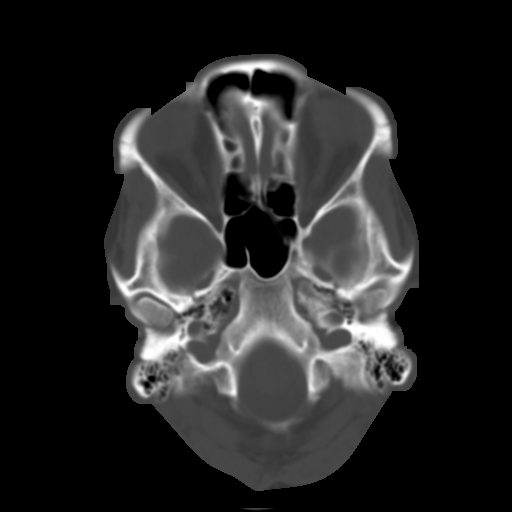
[im 5/28  brain]
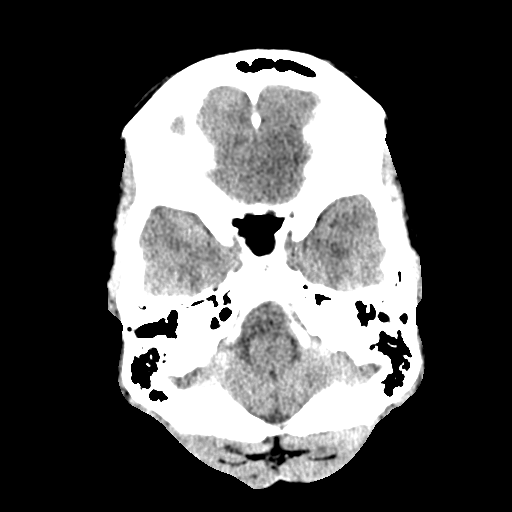
[im 7/28  brain]
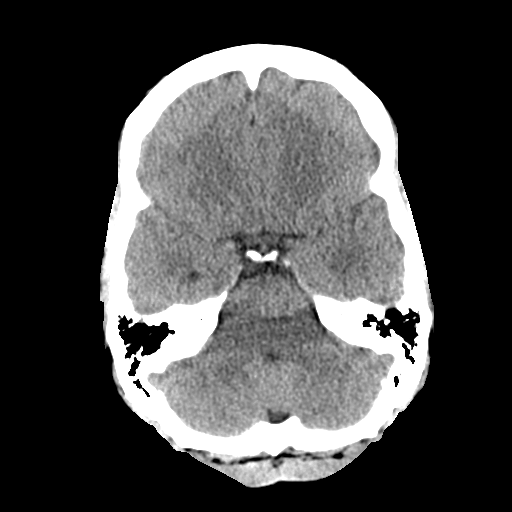
[im 9/28  brain]
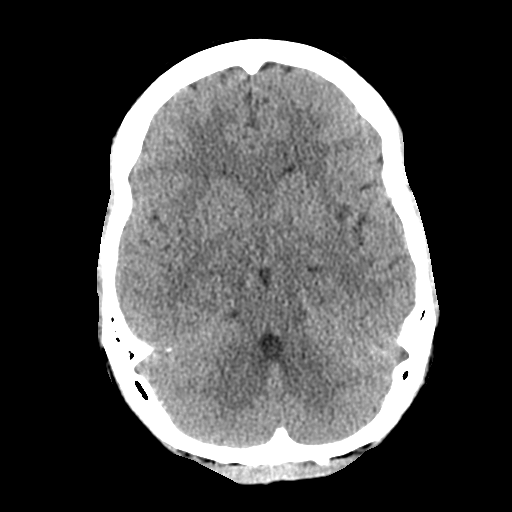
[im 11/28  brain]
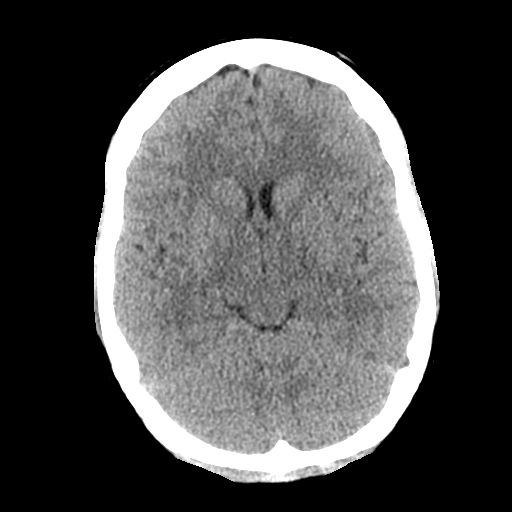
[im 11/28  bone]
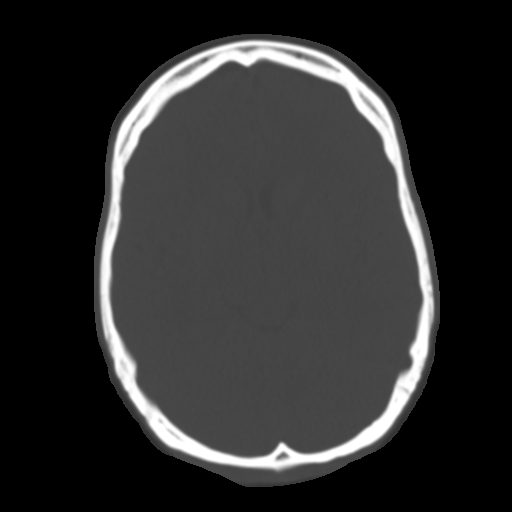
[im 13/28  brain]
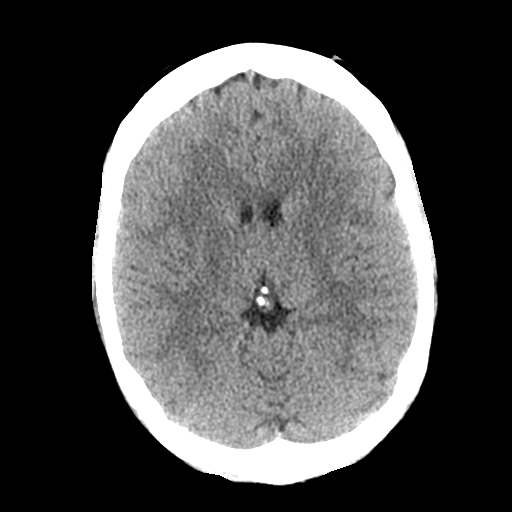
[im 16/28  brain]
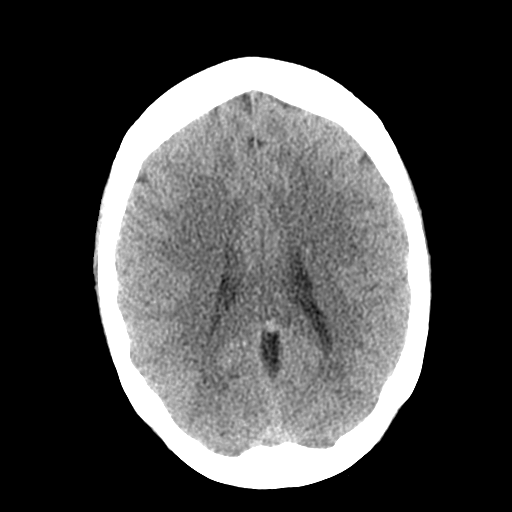
[im 18/28  brain]
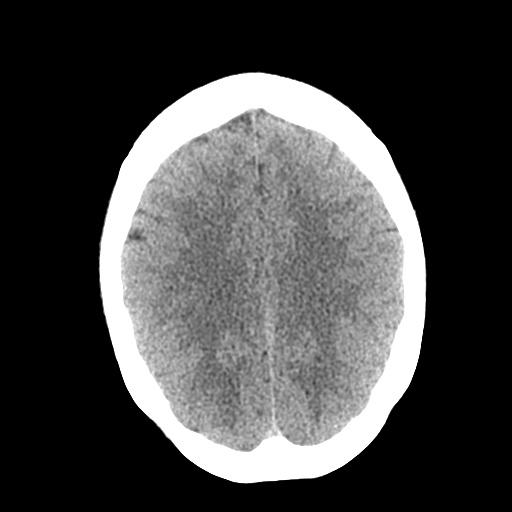
[im 20/28  brain]
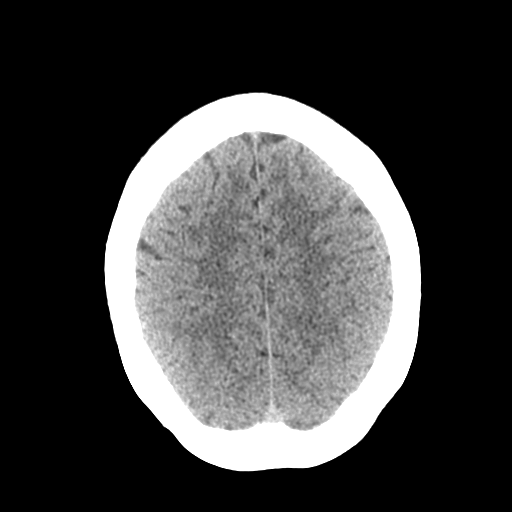
[im 20/28  bone]
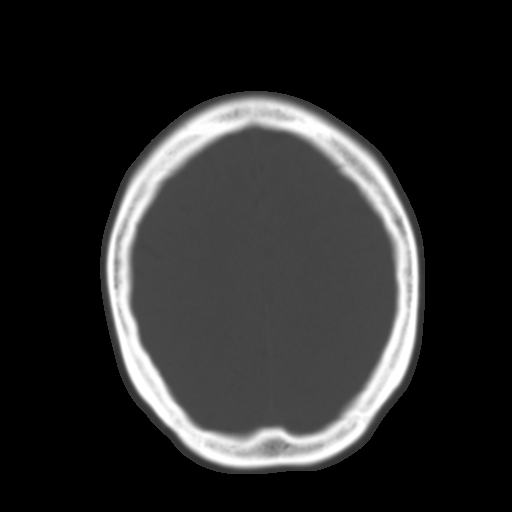
[im 22/28  brain]
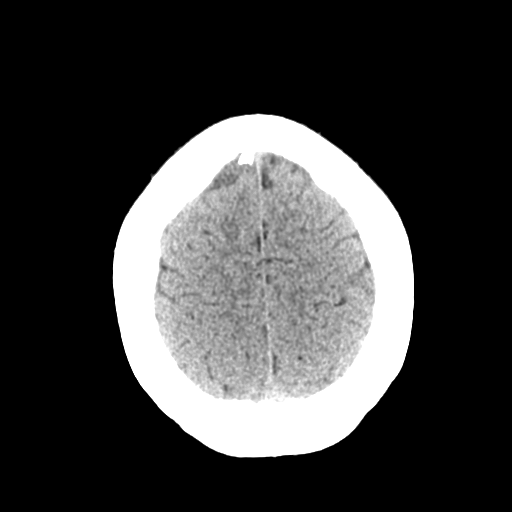
[im 24/28  brain]
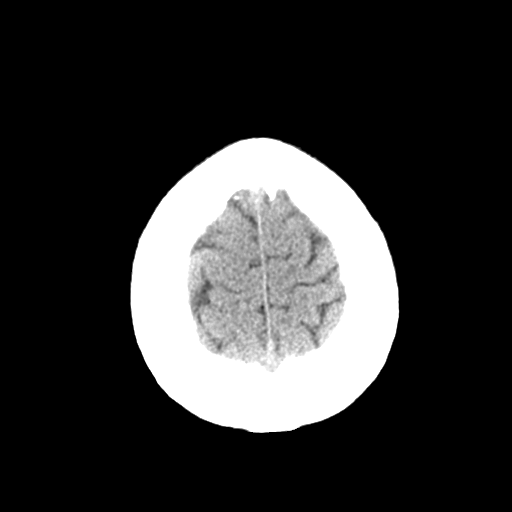
[im 26/28  brain]
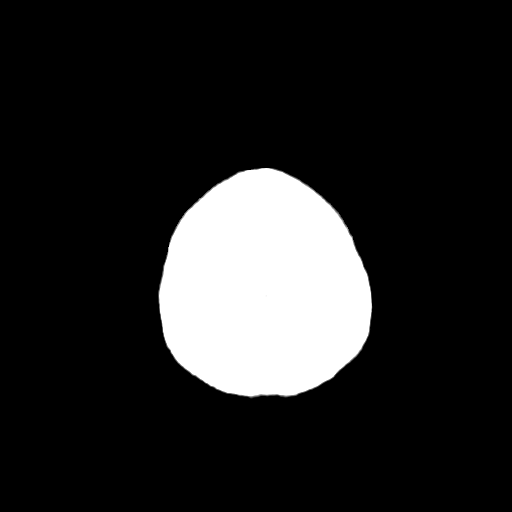

[Series 5: sagittal soft tissue · sagittal · 0.31mm/px · 3 of 52 slices shown]
[im 18/52  brain]
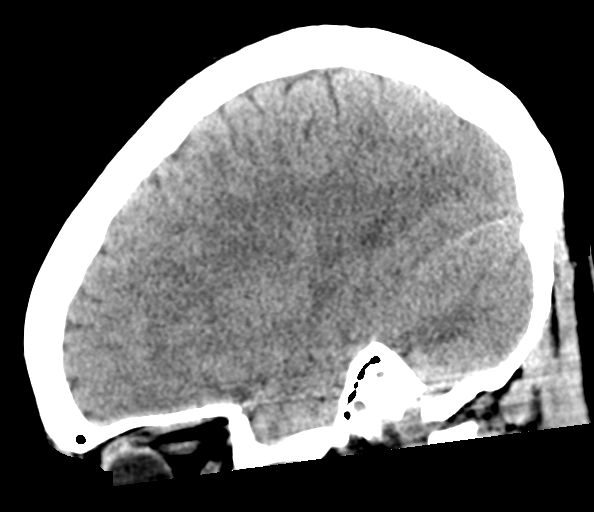
[im 26/52  brain]
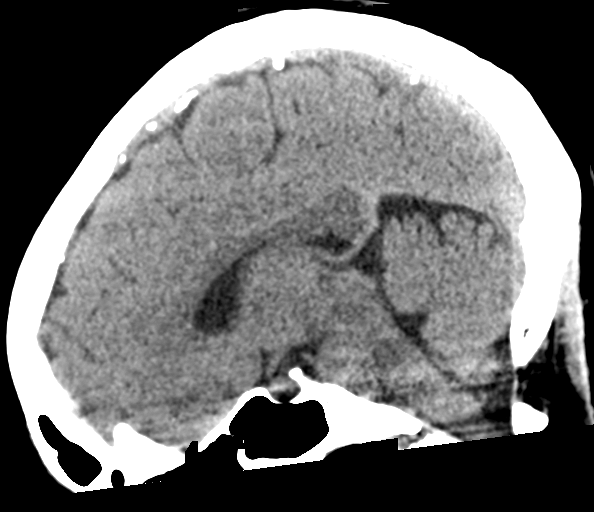
[im 35/52  brain]
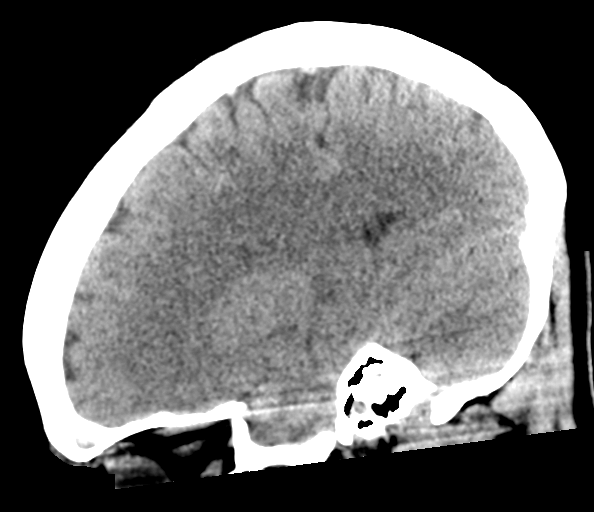

[15 of 35 positions shown; findings below may reference images not displayed]

FINDINGS: Brain: No intracranial hemorrhage, mass effect, or midline shift. No
hydrocephalus. The basilar cisterns are patent. No evidence of
territorial infarct or acute ischemia. No extra-axial or
intracranial fluid collection.

Vascular: No hyperdense vessel or unexpected calcification.

Skull: Normal. Negative for fracture or focal lesion.

Sinuses/Orbits: Mucosal thickening of ethmoid air cells and right
side of frontal sinus. Included orbits are unremarkable. Mastoid air
cells are clear.

Other: None.
IMPRESSION: 1. No acute intracranial abnormality.
2. Mild paranasal sinus inflammation.

## 2022-03-11 ENCOUNTER — Encounter: Payer: 59 | Admitting: Family Medicine

## 2022-04-08 ENCOUNTER — Encounter: Payer: Self-pay | Admitting: Obstetrics & Gynecology

## 2022-04-29 ENCOUNTER — Encounter: Payer: Self-pay | Admitting: Obstetrics & Gynecology

## 2022-06-10 ENCOUNTER — Encounter: Payer: 59 | Admitting: Family Medicine

## 2022-10-20 DIAGNOSIS — R109 Unspecified abdominal pain: Secondary | ICD-10-CM | POA: Diagnosis not present

## 2022-10-20 DIAGNOSIS — R7301 Impaired fasting glucose: Secondary | ICD-10-CM | POA: Diagnosis not present

## 2022-10-20 DIAGNOSIS — E7800 Pure hypercholesterolemia, unspecified: Secondary | ICD-10-CM | POA: Diagnosis not present

## 2023-07-01 DIAGNOSIS — E7800 Pure hypercholesterolemia, unspecified: Secondary | ICD-10-CM | POA: Diagnosis not present

## 2023-07-01 DIAGNOSIS — R7301 Impaired fasting glucose: Secondary | ICD-10-CM | POA: Diagnosis not present

## 2023-07-01 DIAGNOSIS — Z0000 Encounter for general adult medical examination without abnormal findings: Secondary | ICD-10-CM | POA: Diagnosis not present

## 2023-12-07 DIAGNOSIS — Z130 Encounter for screening for diseases of the blood and blood-forming organs and certain disorders involving the immune mechanism: Secondary | ICD-10-CM | POA: Diagnosis not present

## 2023-12-07 DIAGNOSIS — R202 Paresthesia of skin: Secondary | ICD-10-CM | POA: Diagnosis not present

## 2023-12-07 DIAGNOSIS — D259 Leiomyoma of uterus, unspecified: Secondary | ICD-10-CM | POA: Diagnosis not present

## 2023-12-07 DIAGNOSIS — Z01411 Encounter for gynecological examination (general) (routine) with abnormal findings: Secondary | ICD-10-CM | POA: Diagnosis not present

## 2023-12-07 DIAGNOSIS — Z113 Encounter for screening for infections with a predominantly sexual mode of transmission: Secondary | ICD-10-CM | POA: Diagnosis not present

## 2023-12-14 ENCOUNTER — Emergency Department: Payer: BC Managed Care – PPO

## 2023-12-14 ENCOUNTER — Emergency Department
Admission: EM | Admit: 2023-12-14 | Discharge: 2023-12-14 | Disposition: A | Discharge status: Home / Self Care | Payer: BC Managed Care – PPO | Attending: Emergency Medicine | Admitting: Emergency Medicine

## 2023-12-14 ENCOUNTER — Other Ambulatory Visit: Payer: Self-pay

## 2023-12-14 DIAGNOSIS — R030 Elevated blood-pressure reading, without diagnosis of hypertension: Secondary | ICD-10-CM | POA: Diagnosis not present

## 2023-12-14 DIAGNOSIS — D509 Iron deficiency anemia, unspecified: Secondary | ICD-10-CM | POA: Diagnosis not present

## 2023-12-14 DIAGNOSIS — R202 Paresthesia of skin: Secondary | ICD-10-CM | POA: Diagnosis not present

## 2023-12-14 DIAGNOSIS — R200 Anesthesia of skin: Secondary | ICD-10-CM | POA: Insufficient documentation

## 2023-12-14 DIAGNOSIS — D500 Iron deficiency anemia secondary to blood loss (chronic): Secondary | ICD-10-CM | POA: Diagnosis not present

## 2023-12-14 DIAGNOSIS — I10 Essential (primary) hypertension: Secondary | ICD-10-CM | POA: Diagnosis not present

## 2023-12-14 LAB — BASIC METABOLIC PANEL
Anion gap: 11 (ref 5–15)
BUN: 10 mg/dL (ref 6–20)
CO2: 26 mmol/L (ref 22–32)
Calcium: 8.9 mg/dL (ref 8.9–10.3)
Chloride: 97 mmol/L — ABNORMAL LOW (ref 98–111)
Creatinine, Ser: 0.74 mg/dL (ref 0.44–1.00)
GFR, Estimated: >60 mL/min (ref >60)
Glucose, Bld: 108 mg/dL — ABNORMAL HIGH (ref 70–99)
Potassium: 3.6 mmol/L (ref 3.5–5.1)
Sodium: 134 mmol/L — ABNORMAL LOW (ref 135–145)

## 2023-12-14 LAB — CBC
HCT: 31.5 % — ABNORMAL LOW (ref 36.0–46.0)
Hemoglobin: 9.2 g/dL — ABNORMAL LOW (ref 12.0–15.0)
MCH: 23.8 pg — ABNORMAL LOW (ref 26.0–34.0)
MCHC: 29.2 g/dL — ABNORMAL LOW (ref 30.0–36.0)
MCV: 81.4 fL (ref 80.0–100.0)
Platelets: 436 10*3/uL — ABNORMAL HIGH (ref 150–400)
RBC: 3.87 MIL/uL (ref 3.87–5.11)
RDW: 18.3 % — ABNORMAL HIGH (ref 11.5–15.5)
WBC: 3.2 10*3/uL — ABNORMAL LOW (ref 4.0–10.5)
nRBC: 0 % (ref 0.0–0.2)

## 2023-12-14 NOTE — ED Provider Notes (Signed)
[facility] Provider Note    Event Date/Time   First MD Initiated Contact with Patient 12/14/23 1149     (approximate)   History   Numbness   HPI  Danielle Ochoa is a 37 y.o. female   presents to the ED with complaint of left arm numbness that goes down into her left leg.  Patient states that she has been having circulatory issues.  She is seeing her OB/GYN and has already been scheduled for iron transfusions on January 2.  Patient currently is taking oral iron due to her chronic anemia secondary to her excessively heavy periods and fibroid issues.  Patient has been taking her opposite hand and hitting herself in the axillary area and attempt to increase circulation.  She denies any head injury, blurred vision, nausea, vomiting, diarrhea.  Patient also reports that prior to arrival her hand appeared to be lighter in color in her fingers but has improved at this time.  She also reports that her blood pressure is usually elevated in the past due to "her nerves".      Physical Exam   Triage Vital Signs: ED Triage Vitals [12/14/23 1026]  Encounter Vitals Group     BP (!) 176/119     Systolic BP Percentile      Diastolic BP Percentile      Pulse Rate 89     Resp 18     Temp 98 F (36.7 C)     Temp src      SpO2 100 %     Weight 130 lb (59 kg)     Height 5\' 7"  (1.702 m)     Head Circumference      Peak Flow      Pain Score 0     Pain Loc      Pain Education      Exclude from Growth Chart     Most recent vital signs: Vitals:   12/14/23 1026 12/14/23 1436  BP: (!) 176/119 (!) 152/100  Pulse: 89   Resp: 18   Temp: 98 F (36.7 C)   SpO2: 100%      General: Awake, no distress.  Alert, able to talk in complete sentences without any respiratory difficulty. CV:  Good peripheral perfusion.  Heart regular rate rhythm. Resp:  Normal effort.  Abd:  No distention.  Other:     ED Results / Procedures / Treatments   Labs (all labs ordered are  listed, but only abnormal results are displayed) Labs Reviewed  BASIC METABOLIC PANEL - Abnormal; Notable for the following components:      Result Value   Sodium 134 (*)    Chloride 97 (*)    Glucose, Bld 108 (*)    All other components within normal limits  CBC - Abnormal; Notable for the following components:   WBC 3.2 (*)    Hemoglobin 9.2 (*)    HCT 31.5 (*)    MCH 23.8 (*)    MCHC 29.2 (*)    RDW 18.3 (*)    Platelets 436 (*)    All other components within normal limits  CBG MONITORING, ED     RADIOLOGY  CT head per radiologist is negative for acute intracranial abnormality. Cervical spine x-ray images were reviewed by myself independent of the radiologist and was negative for fracture or subluxation.    PROCEDURES:  Critical Care performed:   Procedures   MEDICATIONS ORDERED IN ED: Medications - No data to  display   IMPRESSION / MDM / ASSESSMENT AND PLAN / ED COURSE  I reviewed the triage vital signs and the nursing notes.   Differential diagnosis includes, but is not limited to, anemia secondary chronic blood loss, chronic menometrorrhagia secondary to fibroids, cervical radiculopathy, paresthesias left upper and lower extremities, elevated blood pressure, hypokalemia, hyponatremia.  37 year old female presents to the ED with complaint of paresthesias to her left upper and lower extremity that started approximately 2 weeks ago.  Patient has history of anemia secondary to chronic menstrual problems due to fibroid and has been scheduled for iron transfusions per her OB/GYN.  This will start January 2.  CT scan was reassuring as was the cervical spine x-ray.  Lab work was discussed with patient and in comparison with her last labs that were done and she was able to pull up on MyChart there is improvement with her hemoglobin hematocrit since she has been taking oral medication.  She is still strongly encouraged to keep her appointment for the iron transfusion.  She does  not have a primary care provider and some of her problems are ongoing and chronic.  I discussed that there is a need for a primary care provider.  Consult for referral PCP was made and patient is aware that she this was being done.  Also a list of clinics in the area was printed and given to her.  Blood pressure was rechecked and still elevated which also needs to be addressed with a repeat blood pressure and monitoring to see if hypertension versus nervousness per patient.  Patient continued to be ambulatory without any assistance in the hallway or difficulty.        Patient's presentation is most consistent with acute illness / injury with system symptoms.  FINAL CLINICAL IMPRESSION(S) / ED DIAGNOSES   Final diagnoses:  Numbness  Iron deficiency anemia due to chronic blood loss  Elevated blood pressure reading     Rx / DC Orders   ED Discharge Orders          Ordered    Ambulatory Referral to Primary Care (Establish Care)       Comments: Needs a PCP   12/14/23 1433             Note:  This document was prepared using Dragon voice recognition software and may include unintentional dictation errors.   Tommi Rumps, PA-C 12/14/23 1510    Sharyn Creamer, MD 12/14/23 Rosamaria Lints

## 2023-12-14 NOTE — Discharge Instructions (Addendum)
A referral was placed for you to get a primary care provider.  The hospital representative should call with a referral and contact information.  Also a list of clinics is on your discharge papers.  Have your blood pressure rechecked again in 1 week.  If there is a family history of high blood pressure and may be that you also need to be treated for hypertension.  Keep your appointment with your OB/GYN to start iron transfusions as already scheduled.  Please go to the following website to schedule new (and existing) patient appointments:   [email or web address]   The following is a list of primary care offices in the area who are accepting new patients at this time.  Please reach out to one of them directly and let them know you would like to schedule an appointment to follow up on an Emergency Department visit, and/or to establish a new primary care provider (PCP).  There are likely other primary care clinics in the are who are accepting new patients, but this is an excellent place to start:  [facility] Lead physician: Dr Shirlee Latch [address] [place], [place] [address] [phone]  [facility] Lead Physician: Dr Alba Cory [address], [place], [place] [address] [phone]  [facility]  Lead Physician: Dr Olevia Perches [address] [place], [place] [address] [phone]  [facility] Lead Physician: Dr Sofie Hartigan [address], [place], [place] [address] [phone]  [facility] Primary Care & [facility] Medicine at [facility] Lead Physician: Dr Bari Edward [address] [place], [place], [place] [address] [phone]

## 2023-12-14 NOTE — ED Triage Notes (Addendum)
Pt comes with c/o numbness to left arm. Pt states she is suppose to start getting iron transfusions. Pt states it radiates down her leg also. Pt states she is having these circulation issues. Pt states this all started 2 weeks ago. Pt states it has been coming and going.   Pt speaking in clear sentences. Pt denies any blurry vision. Pt states some dizziness.   Pt denies any medication for BP. Pt states it has been elevated in past due to nerves.

## 2023-12-23 ENCOUNTER — Non-Acute Institutional Stay (HOSPITAL_COMMUNITY)
Admission: RE | Admit: 2023-12-23 | Discharge: 2023-12-23 | Disposition: A | Discharge status: Home / Self Care | Payer: BC Managed Care – PPO | Source: Ambulatory Visit | Attending: Internal Medicine | Admitting: Internal Medicine

## 2023-12-23 DIAGNOSIS — D649 Anemia, unspecified: Secondary | ICD-10-CM | POA: Insufficient documentation

## 2023-12-23 MED ORDER — SODIUM CHLORIDE 0.9 % IV SOLN: INTRAVENOUS | Status: DC | PRN

## 2023-12-23 MED ORDER — FERUMOXYTOL INJECTION 510 MG/17 ML
510.0000 mg | Freq: Once | INTRAVENOUS | Status: AC
  Administered 2023-12-23: 510 mg via INTRAVENOUS
  Filled 2023-12-23: qty 17

## 2023-12-23 NOTE — Progress Notes (Signed)
 PATIENT CARE CENTER NOTE  Diagnosis: ICD-10: D64.9: Anemia, unspecified   Provider: Sudie Hinders, MD  Procedure: Feraheme 510 mg   Note: Patient received Feraheme 510 mg (dose #1 of 2). Pt tolerated infusion with no adverse reaction. Pt observed for 30 minutes post infusion. Vital signs are stable. AVS printed and given to pt. Pt advised she should return in one week to receive second dose, and can schedule next appointment at the front desk. Patient is alert, oriented, and ambulatory at discharge.

## 2023-12-28 ENCOUNTER — Encounter: Payer: Self-pay | Admitting: Neurology

## 2023-12-28 ENCOUNTER — Other Ambulatory Visit: Payer: Self-pay

## 2023-12-28 DIAGNOSIS — R202 Paresthesia of skin: Secondary | ICD-10-CM | POA: Diagnosis not present

## 2023-12-28 DIAGNOSIS — F4321 Adjustment disorder with depressed mood: Secondary | ICD-10-CM | POA: Diagnosis not present

## 2023-12-30 ENCOUNTER — Ambulatory Visit (HOSPITAL_COMMUNITY)
Admission: RE | Admit: 2023-12-30 | Discharge: 2023-12-30 | Disposition: A | Discharge status: Home / Self Care | Payer: Medicaid Other | Source: Ambulatory Visit | Attending: Internal Medicine | Admitting: Internal Medicine

## 2023-12-30 DIAGNOSIS — R202 Paresthesia of skin: Secondary | ICD-10-CM | POA: Insufficient documentation

## 2023-12-30 MED ORDER — SODIUM CHLORIDE 0.9 % IV SOLN: INTRAVENOUS | Status: DC | PRN

## 2023-12-30 MED ORDER — SODIUM CHLORIDE 0.9 % IV SOLN
510.0000 mg | Freq: Once | INTRAVENOUS | Status: AC
  Administered 2023-12-30: 510 mg via INTRAVENOUS
  Filled 2023-12-30: qty 17

## 2023-12-30 NOTE — Progress Notes (Signed)
 PATIENT CARE CENTER NOTE  Diagnosis: ICD-10: D64.9: Anemia, unspecified   Provider: Lavonia Guppy, MD  Procedure: Feraheme 510 mg   Note: Patient received Feraheme 510 mg infusion (dose #2 of 2). Pt tolerated infusion with no adverse reaction. Pt observed for 30 minutes post infusion. Vital signs are stable. AVS offered, but pt declined. Pt is alert, oriented, and ambulatory at discharge.

## 2024-01-14 ENCOUNTER — Encounter: Payer: BC Managed Care – PPO | Admitting: Neurology

## 2024-01-17 DIAGNOSIS — D259 Leiomyoma of uterus, unspecified: Secondary | ICD-10-CM | POA: Diagnosis not present

## 2024-01-18 ENCOUNTER — Other Ambulatory Visit: Payer: Self-pay | Admitting: Family Medicine

## 2024-01-18 DIAGNOSIS — R202 Paresthesia of skin: Secondary | ICD-10-CM

## 2024-02-10 ENCOUNTER — Encounter: Payer: BC Managed Care – PPO | Admitting: Neurology

## 2024-02-17 DIAGNOSIS — R202 Paresthesia of skin: Secondary | ICD-10-CM | POA: Diagnosis not present

## 2024-02-17 DIAGNOSIS — Z0000 Encounter for general adult medical examination without abnormal findings: Secondary | ICD-10-CM | POA: Diagnosis not present

## 2024-02-17 DIAGNOSIS — D509 Iron deficiency anemia, unspecified: Secondary | ICD-10-CM | POA: Diagnosis not present

## 2024-02-18 ENCOUNTER — Encounter: Payer: Self-pay | Admitting: Neurology

## 2024-02-18 ENCOUNTER — Encounter: Payer: BC Managed Care – PPO | Admitting: Neurology

## 2024-03-02 DIAGNOSIS — D649 Anemia, unspecified: Secondary | ICD-10-CM | POA: Diagnosis not present

## 2024-03-03 ENCOUNTER — Ambulatory Visit (INDEPENDENT_AMBULATORY_CARE_PROVIDER_SITE_OTHER): Payer: BC Managed Care – PPO | Admitting: Neurology

## 2024-03-03 DIAGNOSIS — R202 Paresthesia of skin: Secondary | ICD-10-CM | POA: Diagnosis not present

## 2024-03-03 NOTE — Procedures (Signed)
  [facility] Neu[facility]  [address] [place], [address]  [place], [place] [address] Tel: [phone] Fax: [phone] Test Date:  03/03/2024  Patient: Danielle Ochoa DOB: [birth year] Physician: Nita Sickle, DO  Sex: Female Height: 5\' 7"  Ref Phys: Sabino Dick, DO  ID#: [id number]   Technician:    History: This is a 38 year old female referred for evaluation of left arm numbness.  NCV & EMG Findings: Extensive electrodiagnostic testing of the left upper extremity shows: Left median, ulnar, and mixed palmar sensory responses are within normal limits. Left median and ulnar motor responses are within normal limits. There is no evidence of active or chronic motor axonal loss changes affecting any of the tested muscles.  Motor unit configuration and recruitment pattern is within normal limits.  Impression: This is a normal study of the left upper extremity.  In particular, there is no evidence of cervical radiculopathy, ulnar neuropathy, or carpal tunnel syndrome.   ___________________________ Nita Sickle, DO    Nerve Conduction Studies   Stim Site NR Peak (ms) Norm Peak (ms) O-P Amp (V) Norm O-P Amp  Left Median Anti Sensory (2nd Digit)  32 C  Wrist    2.6 <3.4 78.7 >20  Left Ulnar Anti Sensory (5th Digit)  32 C  Wrist    2.6 <3.1 72.3 >12     Stim Site NR Onset (ms) Norm Onset (ms) O-P Amp (mV) Norm O-P Amp Site1 Site2 Delta-0 (ms) Dist (cm) Vel (m/s) Norm Vel (m/s)  Left Median Motor (Abd Poll Brev)  32 C  Wrist    2.4 <3.9 12.2 >6 Elbow Wrist 5.3 30.0 57 >50  Elbow    7.7  10.9         Left Ulnar Motor (Abd Dig Minimi)  32 C  Wrist    2.4 <3.1 10.3 >7 B Elbow Wrist 4.2 23.0 55 >50  B Elbow    6.6  9.6  A Elbow B Elbow 1.7 10.0 59 >50  A Elbow    8.3  9.4            Stim Site NR Peak (ms) Norm Peak (ms) P-T Amp (V) Site1 Site2 Delta-P (ms) Norm Delta (ms)  Left Median/Ulnar Palm Comparison (Wrist - 8cm)  32 C  Median Palm    1.7 <2.2 122.4 Median Palm  Ulnar Palm 0.3   Ulnar Palm    1.4 <2.2 57.5       Electromyography   Side Muscle Ins.Act Fibs Fasc Recrt Amp Dur Poly Activation Comment  Left 1stDorInt Nml Nml Nml Nml Nml Nml Nml Nml N/A  Left PronatorTeres Nml Nml Nml Nml Nml Nml Nml Nml N/A  Left Biceps Nml Nml Nml Nml Nml Nml Nml Nml N/A  Left Triceps Nml Nml Nml Nml Nml Nml Nml Nml N/A  Left Deltoid Nml Nml Nml Nml Nml Nml Nml Nml N/A      Waveforms:

## 2024-03-14 DIAGNOSIS — Z86018 Personal history of other benign neoplasm: Secondary | ICD-10-CM | POA: Diagnosis not present

## 2024-03-14 DIAGNOSIS — N920 Excessive and frequent menstruation with regular cycle: Secondary | ICD-10-CM | POA: Diagnosis not present

## 2024-03-14 DIAGNOSIS — D250 Submucous leiomyoma of uterus: Secondary | ICD-10-CM | POA: Diagnosis not present

## 2024-03-14 DIAGNOSIS — D251 Intramural leiomyoma of uterus: Secondary | ICD-10-CM | POA: Diagnosis not present

## 2024-03-14 DIAGNOSIS — D252 Subserosal leiomyoma of uterus: Secondary | ICD-10-CM | POA: Diagnosis not present

## 2024-03-14 DIAGNOSIS — Z1330 Encounter for screening examination for mental health and behavioral disorders, unspecified: Secondary | ICD-10-CM | POA: Diagnosis not present

## 2024-03-20 DIAGNOSIS — N920 Excessive and frequent menstruation with regular cycle: Secondary | ICD-10-CM | POA: Diagnosis not present

## 2024-03-20 DIAGNOSIS — Z01812 Encounter for preprocedural laboratory examination: Secondary | ICD-10-CM | POA: Diagnosis not present

## 2024-03-20 DIAGNOSIS — D219 Benign neoplasm of connective and other soft tissue, unspecified: Secondary | ICD-10-CM | POA: Diagnosis not present

## 2024-03-27 ENCOUNTER — Encounter: Payer: Self-pay | Admitting: Family Medicine

## 2024-03-28 DIAGNOSIS — Z1151 Encounter for screening for human papillomavirus (HPV): Secondary | ICD-10-CM | POA: Diagnosis not present

## 2024-03-28 DIAGNOSIS — D251 Intramural leiomyoma of uterus: Secondary | ICD-10-CM | POA: Diagnosis not present

## 2024-03-28 DIAGNOSIS — Z139 Encounter for screening, unspecified: Secondary | ICD-10-CM | POA: Diagnosis not present

## 2024-03-28 DIAGNOSIS — N920 Excessive and frequent menstruation with regular cycle: Secondary | ICD-10-CM | POA: Diagnosis not present

## 2024-03-28 DIAGNOSIS — Z01419 Encounter for gynecological examination (general) (routine) without abnormal findings: Secondary | ICD-10-CM | POA: Diagnosis not present

## 2024-03-28 DIAGNOSIS — Z124 Encounter for screening for malignant neoplasm of cervix: Secondary | ICD-10-CM | POA: Diagnosis not present

## 2024-03-28 DIAGNOSIS — Z800 Family history of malignant neoplasm of digestive organs: Secondary | ICD-10-CM | POA: Diagnosis not present

## 2024-03-29 ENCOUNTER — Ambulatory Visit
Admission: RE | Admit: 2024-03-29 | Discharge: 2024-03-29 | Disposition: A | Discharge status: Home / Self Care | Payer: BC Managed Care – PPO | Source: Ambulatory Visit | Attending: Family Medicine

## 2024-03-29 DIAGNOSIS — R202 Paresthesia of skin: Secondary | ICD-10-CM

## 2024-03-29 MED ORDER — GADOPICLENOL 0.5 MMOL/ML IV SOLN
6.0000 mL | Freq: Once | INTRAVENOUS | Status: AC | PRN
  Administered 2024-03-29: 6 mL via INTRAVENOUS

## 2024-04-05 DIAGNOSIS — D252 Subserosal leiomyoma of uterus: Secondary | ICD-10-CM | POA: Diagnosis not present

## 2024-04-05 DIAGNOSIS — D251 Intramural leiomyoma of uterus: Secondary | ICD-10-CM | POA: Diagnosis not present

## 2024-04-05 DIAGNOSIS — D219 Benign neoplasm of connective and other soft tissue, unspecified: Secondary | ICD-10-CM | POA: Diagnosis not present

## 2024-04-05 DIAGNOSIS — D259 Leiomyoma of uterus, unspecified: Secondary | ICD-10-CM | POA: Diagnosis not present

## 2024-04-05 DIAGNOSIS — N920 Excessive and frequent menstruation with regular cycle: Secondary | ICD-10-CM | POA: Diagnosis not present

## 2024-04-19 DIAGNOSIS — N920 Excessive and frequent menstruation with regular cycle: Secondary | ICD-10-CM | POA: Diagnosis not present

## 2024-04-19 DIAGNOSIS — Z09 Encounter for follow-up examination after completed treatment for conditions other than malignant neoplasm: Secondary | ICD-10-CM | POA: Diagnosis not present

## 2024-04-19 DIAGNOSIS — D219 Benign neoplasm of connective and other soft tissue, unspecified: Secondary | ICD-10-CM | POA: Diagnosis not present

## 2024-06-29 DIAGNOSIS — N939 Abnormal uterine and vaginal bleeding, unspecified: Secondary | ICD-10-CM | POA: Diagnosis not present

## 2024-10-30 ENCOUNTER — Emergency Department (HOSPITAL_BASED_OUTPATIENT_CLINIC_OR_DEPARTMENT_OTHER): Admitting: Radiology

## 2024-10-30 ENCOUNTER — Encounter (HOSPITAL_BASED_OUTPATIENT_CLINIC_OR_DEPARTMENT_OTHER): Payer: Self-pay | Admitting: Emergency Medicine

## 2024-10-30 ENCOUNTER — Emergency Department (HOSPITAL_BASED_OUTPATIENT_CLINIC_OR_DEPARTMENT_OTHER)
Admission: EM | Admit: 2024-10-30 | Discharge: 2024-10-30 | Disposition: A | Discharge status: Home / Self Care | Attending: Emergency Medicine | Admitting: Emergency Medicine

## 2024-10-30 ENCOUNTER — Other Ambulatory Visit: Payer: Self-pay

## 2024-10-30 DIAGNOSIS — R0789 Other chest pain: Secondary | ICD-10-CM | POA: Diagnosis not present

## 2024-10-30 DIAGNOSIS — Z862 Personal history of diseases of the blood and blood-forming organs and certain disorders involving the immune mechanism: Secondary | ICD-10-CM | POA: Diagnosis not present

## 2024-10-30 DIAGNOSIS — R079 Chest pain, unspecified: Secondary | ICD-10-CM | POA: Diagnosis not present

## 2024-10-30 DIAGNOSIS — D649 Anemia, unspecified: Secondary | ICD-10-CM | POA: Diagnosis not present

## 2024-10-30 LAB — BASIC METABOLIC PANEL WITH GFR
Anion gap: 9 (ref 5–15)
BUN: 7 mg/dL (ref 6–20)
CO2: 29 mmol/L (ref 22–32)
Calcium: 9.5 mg/dL (ref 8.9–10.3)
Chloride: 102 mmol/L (ref 98–111)
Creatinine, Ser: 0.7 mg/dL (ref 0.44–1.00)
GFR, Estimated: >60 mL/min (ref >60)
Glucose, Bld: 86 mg/dL (ref 70–99)
Potassium: 3.7 mmol/L (ref 3.5–5.1)
Sodium: 141 mmol/L (ref 135–145)

## 2024-10-30 LAB — CBC
HCT: 32.9 % — ABNORMAL LOW (ref 36.0–46.0)
Hemoglobin: 11 g/dL — ABNORMAL LOW (ref 12.0–15.0)
MCH: 31.1 pg (ref 26.0–34.0)
MCHC: 33.4 g/dL (ref 30.0–36.0)
MCV: 92.9 fL (ref 80.0–100.0)
Platelets: 315 K/uL (ref 150–400)
RBC: 3.54 MIL/uL — ABNORMAL LOW (ref 3.87–5.11)
RDW: 13 % (ref 11.5–15.5)
WBC: 4.6 K/uL (ref 4.0–10.5)
nRBC: 0 % (ref 0.0–0.2)

## 2024-10-30 LAB — TROPONIN T, HIGH SENSITIVITY: Troponin T High Sensitivity: <15 ng/L (ref 0–19)

## 2024-10-30 NOTE — Discharge Instructions (Addendum)
 As we discussed, your evaluation in the ED is reassuring. You can be discharged home and are encouraged to follow up with your doctor at [facility] to reassess your iron levels and evaluate any need for future iron infusions.   Follow up with your primary care physician if the chest tightness continues to be an issue.   Return to the ED with any new or concerning symptoms at any time.

## 2024-10-30 NOTE — ED Triage Notes (Signed)
 Chest tightness  for a while now Left side  I can tell that I'm not getting good circulation  Comes and goes

## 2024-10-30 NOTE — ED Provider Notes (Signed)
 [place] EMERGENCY DEPARTMENT AT [facility] Provider Note   CSN: [id number] Arrival date & time: 10/30/24  1614     Patient presents with: Chest Pain   Danielle Ochoa is a 38 y.o. female.   Patient with history of anemia presents for evaluation of left chest tightness and heart pounding type palpitations. She states these symptoms have been present for a long time. She also reports she feels she has become anemic noticing that she is constantly cold, her hands appear pale, she is currently menstruating with her typical heavy flow with history of uterine fibroids. No syncope or near syncope. No vomiting. Her last iron infusion was approximately 5 months ago.   The history is provided by the patient. No language interpreter was used.  Chest Pain      Prior to Admission medications   Medication Sig Start Date End Date Taking? Authorizing Provider  ibuprofen  (ADVIL ,MOTRIN ) 200 MG tablet Take 800 mg by mouth every 6 (six) hours as needed for moderate pain.    [provider]  Multiple Vitamin (MULTIVITAMIN WITH MINERALS) TABS tablet Take 1 tablet by mouth daily.    [provider]    Allergies: Pineapple    Review of Systems  Cardiovascular:  Positive for chest pain.    Updated Vital Signs BP (!) 155/101 (BP Location: Right Arm)   Pulse 77   Temp 98 F (36.7 C) (Oral)   Resp 17   LMP 10/26/2024   SpO2 100%   Physical Exam Vitals and nursing note reviewed.  Constitutional:      Appearance: She is well-developed.  HENT:     Head: Normocephalic.  Eyes:     Conjunctiva/sclera: Conjunctivae normal.     Comments: No conjunctival pallor.   Neck:     Vascular: No carotid bruit.  Cardiovascular:     Rate and Rhythm: Normal rate and regular rhythm.     Heart sounds: No murmur heard. Pulmonary:     Effort: Pulmonary effort is normal.     Breath sounds: Normal breath sounds. No wheezing, rhonchi or rales.  Chest:     Chest wall: No  tenderness.  Abdominal:     General: Bowel sounds are normal.     Palpations: Abdomen is soft.     Tenderness: There is no abdominal tenderness. There is no guarding or rebound.  Musculoskeletal:        General: Normal range of motion.     Cervical back: Normal range of motion and neck supple.     Right lower leg: No edema.     Left lower leg: No edema.  Skin:    General: Skin is warm and dry.     Capillary Refill: Capillary refill takes less than 2 seconds. Good Capillary refill in UE's and LE's.  Neurological:     General: No focal deficit present.     Mental Status: She is alert and oriented to person, place, and time.     (all labs ordered are listed, but only abnormal results are displayed) Labs Reviewed  CBC - Abnormal; Notable for the following components:      Result Value   RBC 3.54 (*)    Hemoglobin 11.0 (*)    HCT 32.9 (*)    All other components within normal limits  BASIC METABOLIC PANEL WITH GFR  PREGNANCY, URINE  TROPONIN T, HIGH SENSITIVITY  TROPONIN T, HIGH SENSITIVITY    EKG: None  Radiology: DG Chest 2 View Result Date: 10/30/2024  EXAM: 2 VIEW(S) XRAY OF THE CHEST 10/30/2024 04:49:14 PM COMPARISON: None available. CLINICAL HISTORY: chest pain FINDINGS: LUNGS AND PLEURA: No focal pulmonary opacity. No pulmonary edema. No pleural effusion. No pneumothorax. HEART AND MEDIASTINUM: No acute abnormality of the cardiac and mediastinal silhouettes. BONES AND SOFT TISSUES: No acute osseous abnormality. IMPRESSION: 1. No acute cardiopulmonary process detected Electronically signed by: Norman Gatlin MD 10/30/2024 05:09 PM EST RP Workstation: [id number]     Procedures   Medications Ordered in the ED - No data to display  Clinical Course as of 10/30/24 1953  Mon Oct 30, 2024  1950 Patient to ED with chronic symptoms of chest tightness, heart pounding. She is concerned regarding her heart, but also feels chest symptoms likely in response to her anemia and is  also concerned about this.   Hgb 11.0, normal VS without sign of hemodynamic instability. EKG unchanged from previous and without ischemia. Troponin normal <15. Labs otherwise reassuring.   Patient reassured. She is felt stable for discharge home. Encouraged to see her hematology doctor at [facility] for full iron study to determine any current or future need for further infusions.   Patient comfortable with plan of dischrage.  [SU]    Clinical Course User Index [SU] Odell Balls, PA-C                                 Medical Decision Making Amount and/or Complexity of Data Reviewed Labs: ordered. Radiology: ordered.        Final diagnoses:  Nonspecific chest pain  History of anemia    ED Discharge Orders     None          Odell Balls, PA-C 10/30/24 1953    Danielle Lamar BROCKS, MD 11/01/24 1041
